# Patient Record
Sex: Male | Born: 1965 | Race: White | Hispanic: No | Smoking: Current every day smoker
Health system: Southern US, Community
[De-identification: ages and names within clinical notes are randomized; demographics above are authoritative.]

## PROBLEM LIST (undated history)

## (undated) DIAGNOSIS — F419 Anxiety disorder, unspecified: Secondary | ICD-10-CM

## (undated) DIAGNOSIS — G459 Transient cerebral ischemic attack, unspecified: Secondary | ICD-10-CM

## (undated) DIAGNOSIS — I209 Angina pectoris, unspecified: Secondary | ICD-10-CM

## (undated) DIAGNOSIS — J439 Emphysema, unspecified: Secondary | ICD-10-CM

## (undated) DIAGNOSIS — K219 Gastro-esophageal reflux disease without esophagitis: Secondary | ICD-10-CM

## (undated) DIAGNOSIS — F329 Major depressive disorder, single episode, unspecified: Secondary | ICD-10-CM

## (undated) DIAGNOSIS — R51 Headache: Secondary | ICD-10-CM

## (undated) DIAGNOSIS — I219 Acute myocardial infarction, unspecified: Secondary | ICD-10-CM

## (undated) DIAGNOSIS — Z8719 Personal history of other diseases of the digestive system: Secondary | ICD-10-CM

## (undated) DIAGNOSIS — F32A Depression, unspecified: Secondary | ICD-10-CM

## (undated) DIAGNOSIS — J069 Acute upper respiratory infection, unspecified: Secondary | ICD-10-CM

## (undated) DIAGNOSIS — M1990 Unspecified osteoarthritis, unspecified site: Secondary | ICD-10-CM

## (undated) DIAGNOSIS — G43909 Migraine, unspecified, not intractable, without status migrainosus: Secondary | ICD-10-CM

## (undated) DIAGNOSIS — R569 Unspecified convulsions: Secondary | ICD-10-CM

## (undated) DIAGNOSIS — J449 Chronic obstructive pulmonary disease, unspecified: Secondary | ICD-10-CM

## (undated) DIAGNOSIS — G8929 Other chronic pain: Secondary | ICD-10-CM

## (undated) DIAGNOSIS — R0602 Shortness of breath: Secondary | ICD-10-CM

## (undated) DIAGNOSIS — M549 Dorsalgia, unspecified: Secondary | ICD-10-CM

## (undated) DIAGNOSIS — F99 Mental disorder, not otherwise specified: Secondary | ICD-10-CM

## (undated) HISTORY — PX: BACK SURGERY: SHX140

---

## 1998-08-06 ENCOUNTER — Emergency Department (HOSPITAL_COMMUNITY): Admission: EM | Admit: 1998-08-06 | Discharge: 1998-08-06 | Payer: Self-pay | Admitting: Emergency Medicine

## 1999-03-08 ENCOUNTER — Emergency Department (HOSPITAL_COMMUNITY): Admission: EM | Admit: 1999-03-08 | Discharge: 1999-03-08 | Payer: Self-pay | Admitting: Emergency Medicine

## 1999-03-08 ENCOUNTER — Encounter: Payer: Self-pay | Admitting: Emergency Medicine

## 1999-07-14 ENCOUNTER — Emergency Department (HOSPITAL_COMMUNITY): Admission: EM | Admit: 1999-07-14 | Discharge: 1999-07-14 | Payer: Self-pay | Admitting: Emergency Medicine

## 2000-12-28 ENCOUNTER — Emergency Department (HOSPITAL_COMMUNITY): Admission: EM | Admit: 2000-12-28 | Discharge: 2000-12-28 | Payer: Self-pay | Admitting: Emergency Medicine

## 2001-10-29 ENCOUNTER — Emergency Department (HOSPITAL_COMMUNITY): Admission: EM | Admit: 2001-10-29 | Discharge: 2001-10-29 | Payer: Self-pay | Admitting: Emergency Medicine

## 2002-06-14 ENCOUNTER — Emergency Department (HOSPITAL_COMMUNITY): Admission: EM | Admit: 2002-06-14 | Discharge: 2002-06-14 | Payer: Self-pay | Admitting: Emergency Medicine

## 2002-06-14 ENCOUNTER — Encounter: Payer: Self-pay | Admitting: Emergency Medicine

## 2002-08-23 ENCOUNTER — Emergency Department (HOSPITAL_COMMUNITY): Admission: EM | Admit: 2002-08-23 | Discharge: 2002-08-23 | Payer: Self-pay | Admitting: *Deleted

## 2003-01-14 ENCOUNTER — Emergency Department (HOSPITAL_COMMUNITY): Admission: EM | Admit: 2003-01-14 | Discharge: 2003-01-15 | Payer: Self-pay

## 2003-01-15 ENCOUNTER — Encounter: Payer: Self-pay | Admitting: Emergency Medicine

## 2003-04-26 ENCOUNTER — Inpatient Hospital Stay (HOSPITAL_COMMUNITY): Admission: EM | Admit: 2003-04-26 | Discharge: 2003-04-28 | Payer: Self-pay | Admitting: Emergency Medicine

## 2003-05-05 ENCOUNTER — Inpatient Hospital Stay (HOSPITAL_COMMUNITY): Admission: EM | Admit: 2003-05-05 | Discharge: 2003-05-07 | Payer: Self-pay | Admitting: *Deleted

## 2003-06-04 ENCOUNTER — Inpatient Hospital Stay (HOSPITAL_COMMUNITY): Admission: EM | Admit: 2003-06-04 | Discharge: 2003-06-06 | Payer: Self-pay | Admitting: Emergency Medicine

## 2003-08-06 ENCOUNTER — Observation Stay (HOSPITAL_COMMUNITY): Admission: EM | Admit: 2003-08-06 | Discharge: 2003-08-08 | Payer: Self-pay | Admitting: Emergency Medicine

## 2003-08-07 ENCOUNTER — Encounter: Payer: Self-pay | Admitting: Cardiology

## 2003-09-12 ENCOUNTER — Emergency Department (HOSPITAL_COMMUNITY): Admission: EM | Admit: 2003-09-12 | Discharge: 2003-09-12 | Payer: Self-pay | Admitting: Emergency Medicine

## 2003-09-23 ENCOUNTER — Emergency Department (HOSPITAL_COMMUNITY): Admission: EM | Admit: 2003-09-23 | Discharge: 2003-09-23 | Payer: Self-pay

## 2003-10-07 ENCOUNTER — Emergency Department (HOSPITAL_COMMUNITY): Admission: EM | Admit: 2003-10-07 | Discharge: 2003-10-07 | Payer: Self-pay | Admitting: Emergency Medicine

## 2003-10-13 ENCOUNTER — Observation Stay (HOSPITAL_COMMUNITY): Admission: EM | Admit: 2003-10-13 | Discharge: 2003-10-14 | Payer: Self-pay | Admitting: Emergency Medicine

## 2004-05-05 ENCOUNTER — Emergency Department (HOSPITAL_COMMUNITY): Admission: EM | Admit: 2004-05-05 | Discharge: 2004-05-05 | Payer: Self-pay | Admitting: Emergency Medicine

## 2004-09-09 ENCOUNTER — Emergency Department (HOSPITAL_COMMUNITY): Admission: EM | Admit: 2004-09-09 | Discharge: 2004-09-09 | Payer: Self-pay | Admitting: *Deleted

## 2004-09-10 ENCOUNTER — Emergency Department (HOSPITAL_COMMUNITY): Admission: EM | Admit: 2004-09-10 | Discharge: 2004-09-10 | Payer: Self-pay | Admitting: *Deleted

## 2004-11-26 ENCOUNTER — Emergency Department (HOSPITAL_COMMUNITY): Admission: EM | Admit: 2004-11-26 | Discharge: 2004-11-26 | Payer: Self-pay | Admitting: Emergency Medicine

## 2005-01-16 ENCOUNTER — Emergency Department (HOSPITAL_COMMUNITY): Admission: EM | Admit: 2005-01-16 | Discharge: 2005-01-17 | Payer: Self-pay | Admitting: Emergency Medicine

## 2005-01-21 ENCOUNTER — Encounter: Admission: RE | Admit: 2005-01-21 | Discharge: 2005-01-21 | Payer: Self-pay | Admitting: Family Medicine

## 2005-02-02 ENCOUNTER — Inpatient Hospital Stay (HOSPITAL_COMMUNITY): Admission: RE | Admit: 2005-02-02 | Discharge: 2005-02-03 | Payer: Self-pay | Admitting: Orthopaedic Surgery

## 2005-03-07 ENCOUNTER — Ambulatory Visit (HOSPITAL_COMMUNITY): Admission: RE | Admit: 2005-03-07 | Discharge: 2005-03-07 | Payer: Self-pay | Admitting: Orthopaedic Surgery

## 2005-09-09 ENCOUNTER — Encounter: Admission: RE | Admit: 2005-09-09 | Discharge: 2005-09-16 | Payer: Self-pay | Admitting: Orthopedic Surgery

## 2005-11-13 ENCOUNTER — Emergency Department (HOSPITAL_COMMUNITY): Admission: EM | Admit: 2005-11-13 | Discharge: 2005-11-13 | Payer: Self-pay | Admitting: Emergency Medicine

## 2006-09-14 ENCOUNTER — Encounter
Admission: RE | Admit: 2006-09-14 | Discharge: 2006-12-13 | Payer: Self-pay | Admitting: Physical Medicine & Rehabilitation

## 2006-09-19 ENCOUNTER — Ambulatory Visit: Payer: Self-pay | Admitting: Physical Medicine & Rehabilitation

## 2006-09-26 ENCOUNTER — Emergency Department (HOSPITAL_COMMUNITY): Admission: EM | Admit: 2006-09-26 | Discharge: 2006-09-26 | Payer: Self-pay | Admitting: Emergency Medicine

## 2006-10-27 ENCOUNTER — Encounter
Admission: RE | Admit: 2006-10-27 | Discharge: 2006-12-26 | Payer: Self-pay | Admitting: Physical Medicine and Rehabilitation

## 2006-10-27 ENCOUNTER — Ambulatory Visit: Payer: Self-pay | Admitting: Physical Medicine and Rehabilitation

## 2006-10-27 ENCOUNTER — Encounter
Admission: RE | Admit: 2006-10-27 | Discharge: 2006-10-27 | Payer: Self-pay | Admitting: Physical Medicine & Rehabilitation

## 2006-11-28 ENCOUNTER — Ambulatory Visit: Payer: Self-pay | Admitting: Physical Medicine & Rehabilitation

## 2006-12-27 ENCOUNTER — Encounter
Admission: RE | Admit: 2006-12-27 | Discharge: 2007-03-27 | Payer: Self-pay | Admitting: Physical Medicine & Rehabilitation

## 2007-01-25 ENCOUNTER — Ambulatory Visit: Payer: Self-pay | Admitting: Physical Medicine & Rehabilitation

## 2007-02-28 ENCOUNTER — Ambulatory Visit: Payer: Self-pay | Admitting: Physical Medicine & Rehabilitation

## 2007-03-04 ENCOUNTER — Emergency Department (HOSPITAL_COMMUNITY): Admission: EM | Admit: 2007-03-04 | Discharge: 2007-03-05 | Payer: Self-pay | Admitting: *Deleted

## 2007-03-18 ENCOUNTER — Emergency Department: Payer: Self-pay | Admitting: Emergency Medicine

## 2007-03-18 ENCOUNTER — Other Ambulatory Visit: Payer: Self-pay

## 2007-03-23 ENCOUNTER — Ambulatory Visit: Payer: Self-pay | Admitting: Emergency Medicine

## 2007-03-23 ENCOUNTER — Emergency Department: Payer: Self-pay | Admitting: Emergency Medicine

## 2007-04-02 ENCOUNTER — Ambulatory Visit: Payer: Self-pay | Admitting: Physical Medicine & Rehabilitation

## 2007-04-02 ENCOUNTER — Encounter
Admission: RE | Admit: 2007-04-02 | Discharge: 2007-07-01 | Payer: Self-pay | Admitting: Physical Medicine & Rehabilitation

## 2007-04-10 ENCOUNTER — Emergency Department (HOSPITAL_COMMUNITY): Admission: EM | Admit: 2007-04-10 | Discharge: 2007-04-10 | Payer: Self-pay | Admitting: Emergency Medicine

## 2007-04-27 ENCOUNTER — Ambulatory Visit: Payer: Self-pay | Admitting: Physical Medicine & Rehabilitation

## 2007-06-11 ENCOUNTER — Encounter
Admission: RE | Admit: 2007-06-11 | Discharge: 2007-09-09 | Payer: Self-pay | Admitting: Physical Medicine & Rehabilitation

## 2007-06-11 ENCOUNTER — Ambulatory Visit: Payer: Self-pay | Admitting: Physical Medicine & Rehabilitation

## 2007-07-09 ENCOUNTER — Ambulatory Visit (HOSPITAL_COMMUNITY)
Admission: RE | Admit: 2007-07-09 | Discharge: 2007-07-09 | Payer: Self-pay | Admitting: Physical Medicine & Rehabilitation

## 2007-07-16 ENCOUNTER — Ambulatory Visit: Payer: Self-pay | Admitting: Physical Medicine & Rehabilitation

## 2007-08-14 ENCOUNTER — Ambulatory Visit: Payer: Self-pay | Admitting: Physical Medicine & Rehabilitation

## 2007-09-10 ENCOUNTER — Encounter
Admission: RE | Admit: 2007-09-10 | Discharge: 2007-12-09 | Payer: Self-pay | Admitting: Physical Medicine & Rehabilitation

## 2007-10-09 ENCOUNTER — Ambulatory Visit: Payer: Self-pay | Admitting: Physical Medicine & Rehabilitation

## 2007-11-01 ENCOUNTER — Emergency Department: Payer: Self-pay | Admitting: Emergency Medicine

## 2007-11-06 ENCOUNTER — Ambulatory Visit: Payer: Self-pay | Admitting: Physical Medicine & Rehabilitation

## 2007-11-15 ENCOUNTER — Emergency Department (HOSPITAL_COMMUNITY): Admission: EM | Admit: 2007-11-15 | Discharge: 2007-11-15 | Payer: Self-pay | Admitting: Emergency Medicine

## 2007-11-28 ENCOUNTER — Encounter
Admission: RE | Admit: 2007-11-28 | Discharge: 2008-02-26 | Payer: Self-pay | Admitting: Physical Medicine & Rehabilitation

## 2007-12-04 ENCOUNTER — Ambulatory Visit: Payer: Self-pay | Admitting: Physical Medicine & Rehabilitation

## 2007-12-07 ENCOUNTER — Emergency Department (HOSPITAL_COMMUNITY): Admission: EM | Admit: 2007-12-07 | Discharge: 2007-12-08 | Payer: Self-pay | Admitting: Emergency Medicine

## 2007-12-23 ENCOUNTER — Emergency Department (HOSPITAL_COMMUNITY): Admission: EM | Admit: 2007-12-23 | Discharge: 2007-12-23 | Payer: Self-pay | Admitting: Family Medicine

## 2008-01-03 ENCOUNTER — Ambulatory Visit: Payer: Self-pay | Admitting: Physical Medicine & Rehabilitation

## 2008-02-01 ENCOUNTER — Ambulatory Visit: Payer: Self-pay | Admitting: Physical Medicine & Rehabilitation

## 2008-02-29 ENCOUNTER — Encounter
Admission: RE | Admit: 2008-02-29 | Discharge: 2008-05-29 | Payer: Self-pay | Admitting: Physical Medicine & Rehabilitation

## 2008-02-29 ENCOUNTER — Ambulatory Visit: Payer: Self-pay | Admitting: Physical Medicine & Rehabilitation

## 2008-04-08 ENCOUNTER — Ambulatory Visit: Payer: Self-pay | Admitting: Physical Medicine & Rehabilitation

## 2008-04-21 ENCOUNTER — Inpatient Hospital Stay (HOSPITAL_COMMUNITY): Admission: EM | Admit: 2008-04-21 | Discharge: 2008-04-24 | Payer: Self-pay | Admitting: Emergency Medicine

## 2008-04-21 ENCOUNTER — Ambulatory Visit: Payer: Self-pay | Admitting: Family Medicine

## 2008-05-07 ENCOUNTER — Ambulatory Visit: Payer: Self-pay | Admitting: Physical Medicine & Rehabilitation

## 2008-06-02 ENCOUNTER — Encounter
Admission: RE | Admit: 2008-06-02 | Discharge: 2008-06-03 | Payer: Self-pay | Admitting: Physical Medicine & Rehabilitation

## 2008-06-03 ENCOUNTER — Ambulatory Visit: Payer: Self-pay | Admitting: Physical Medicine & Rehabilitation

## 2008-06-18 ENCOUNTER — Emergency Department (HOSPITAL_COMMUNITY): Admission: EM | Admit: 2008-06-18 | Discharge: 2008-06-18 | Payer: Self-pay | Admitting: Emergency Medicine

## 2008-06-19 ENCOUNTER — Encounter: Admission: RE | Admit: 2008-06-19 | Discharge: 2008-06-19 | Payer: Self-pay | Admitting: Orthopaedic Surgery

## 2008-07-07 ENCOUNTER — Emergency Department (HOSPITAL_COMMUNITY): Admission: EM | Admit: 2008-07-07 | Discharge: 2008-07-07 | Payer: Self-pay | Admitting: Emergency Medicine

## 2008-09-06 ENCOUNTER — Emergency Department (HOSPITAL_COMMUNITY): Admission: EM | Admit: 2008-09-06 | Discharge: 2008-09-06 | Payer: Self-pay | Admitting: *Deleted

## 2008-09-15 ENCOUNTER — Emergency Department (HOSPITAL_COMMUNITY): Admission: EM | Admit: 2008-09-15 | Discharge: 2008-09-15 | Payer: Self-pay | Admitting: Emergency Medicine

## 2008-10-27 ENCOUNTER — Emergency Department (HOSPITAL_COMMUNITY): Admission: EM | Admit: 2008-10-27 | Discharge: 2008-10-27 | Payer: Self-pay | Admitting: Emergency Medicine

## 2009-02-17 ENCOUNTER — Emergency Department (HOSPITAL_COMMUNITY): Admission: EM | Admit: 2009-02-17 | Discharge: 2009-02-17 | Payer: Self-pay | Admitting: Emergency Medicine

## 2009-03-27 ENCOUNTER — Emergency Department (HOSPITAL_COMMUNITY): Admission: EM | Admit: 2009-03-27 | Discharge: 2009-03-27 | Payer: Self-pay | Admitting: Emergency Medicine

## 2009-04-02 ENCOUNTER — Emergency Department (HOSPITAL_COMMUNITY): Admission: EM | Admit: 2009-04-02 | Discharge: 2009-04-02 | Payer: Self-pay | Admitting: Family Medicine

## 2009-04-02 ENCOUNTER — Emergency Department (HOSPITAL_COMMUNITY): Admission: EM | Admit: 2009-04-02 | Discharge: 2009-04-02 | Payer: Self-pay | Admitting: Emergency Medicine

## 2009-06-10 ENCOUNTER — Emergency Department: Payer: Self-pay | Admitting: Emergency Medicine

## 2009-07-29 ENCOUNTER — Emergency Department (HOSPITAL_COMMUNITY): Admission: EM | Admit: 2009-07-29 | Discharge: 2009-07-30 | Payer: Self-pay | Admitting: Emergency Medicine

## 2009-08-02 ENCOUNTER — Emergency Department (HOSPITAL_COMMUNITY): Admission: EM | Admit: 2009-08-02 | Discharge: 2009-08-02 | Payer: Self-pay | Admitting: Emergency Medicine

## 2009-08-18 ENCOUNTER — Emergency Department (HOSPITAL_COMMUNITY): Admission: EM | Admit: 2009-08-18 | Discharge: 2009-08-18 | Payer: Self-pay | Admitting: Emergency Medicine

## 2009-08-23 ENCOUNTER — Emergency Department (HOSPITAL_COMMUNITY): Admission: EM | Admit: 2009-08-23 | Discharge: 2009-08-23 | Payer: Self-pay | Admitting: Emergency Medicine

## 2009-09-02 ENCOUNTER — Encounter: Admission: RE | Admit: 2009-09-02 | Discharge: 2009-09-02 | Payer: Self-pay | Admitting: Emergency Medicine

## 2009-10-01 ENCOUNTER — Emergency Department (HOSPITAL_COMMUNITY): Admission: EM | Admit: 2009-10-01 | Discharge: 2009-10-01 | Payer: Self-pay | Admitting: Emergency Medicine

## 2009-10-25 ENCOUNTER — Emergency Department: Payer: Self-pay | Admitting: Emergency Medicine

## 2009-10-26 DEATH — deceased

## 2010-01-23 ENCOUNTER — Emergency Department (HOSPITAL_COMMUNITY): Admission: EM | Admit: 2010-01-23 | Discharge: 2010-01-23 | Payer: Self-pay | Admitting: Emergency Medicine

## 2010-03-04 ENCOUNTER — Emergency Department (HOSPITAL_COMMUNITY): Admission: EM | Admit: 2010-03-04 | Discharge: 2009-10-14 | Payer: Self-pay | Admitting: Emergency Medicine

## 2010-04-18 ENCOUNTER — Encounter: Payer: Self-pay | Admitting: Neurology

## 2010-04-18 ENCOUNTER — Encounter: Payer: Self-pay | Admitting: Family Medicine

## 2010-06-12 LAB — BASIC METABOLIC PANEL
CO2: 27 mEq/L (ref 19–32)
Calcium: 9.5 mg/dL (ref 8.4–10.5)
Chloride: 108 mEq/L (ref 96–112)
Creatinine, Ser: 1.13 mg/dL (ref 0.4–1.5)
Glucose, Bld: 109 mg/dL — ABNORMAL HIGH (ref 70–99)

## 2010-06-12 LAB — CBC
MCH: 29 pg (ref 26.0–34.0)
MCHC: 33.3 g/dL (ref 30.0–36.0)
MCV: 87.1 fL (ref 78.0–100.0)
RBC: 5.56 MIL/uL (ref 4.22–5.81)
RDW: 13 % (ref 11.5–15.5)

## 2010-06-12 LAB — POCT CARDIAC MARKERS
CKMB, poc: 1.1 ng/mL (ref 1.0–8.0)
CKMB, poc: <1 ng/mL — ABNORMAL LOW (ref 1.0–8.0)
Troponin i, poc: <0.05 ng/mL (ref 0.00–0.09)

## 2010-06-12 LAB — DIFFERENTIAL
Basophils Absolute: 0.1 10*3/uL (ref 0.0–0.1)
Lymphocytes Relative: 33 % (ref 12–46)
Monocytes Absolute: 0.5 10*3/uL (ref 0.1–1.0)
Neutro Abs: 5.9 10*3/uL (ref 1.7–7.7)

## 2010-06-12 LAB — RAPID URINE DRUG SCREEN, HOSP PERFORMED
Amphetamines: NOT DETECTED
Benzodiazepines: POSITIVE — AB
Cocaine: NOT DETECTED
Tetrahydrocannabinol: NOT DETECTED

## 2010-06-13 LAB — RAPID URINE DRUG SCREEN, HOSP PERFORMED
Amphetamines: NOT DETECTED
Benzodiazepines: POSITIVE — AB
Tetrahydrocannabinol: POSITIVE — AB

## 2010-06-13 LAB — CK TOTAL AND CKMB (NOT AT ARMC)
CK, MB: 2.6 ng/mL (ref 0.3–4.0)
Relative Index: 2.1 (ref 0.0–2.5)
Total CK: 123 U/L (ref 7–232)

## 2010-06-13 LAB — BRAIN NATRIURETIC PEPTIDE: Pro B Natriuretic peptide (BNP): <30 pg/mL (ref 0.0–100.0)

## 2010-06-13 LAB — HEMOGLOBIN A1C: Hgb A1c MFr Bld: 5.8 % (ref 4.6–6.1)

## 2010-06-14 LAB — POCT I-STAT, CHEM 8
BUN: 12 mg/dL (ref 6–23)
Calcium, Ion: 1.13 mmol/L (ref 1.12–1.32)
Chloride: 106 mEq/L (ref 96–112)
Creatinine, Ser: 1.1 mg/dL (ref 0.4–1.5)
Glucose, Bld: 106 mg/dL — ABNORMAL HIGH (ref 70–99)
TCO2: 27 mmol/L (ref 0–100)

## 2010-07-05 LAB — POCT CARDIAC MARKERS
CKMB, poc: <1 ng/mL — ABNORMAL LOW (ref 1.0–8.0)
Myoglobin, poc: 150 ng/mL (ref 12–200)

## 2010-07-05 LAB — RAPID URINE DRUG SCREEN, HOSP PERFORMED
Amphetamines: NOT DETECTED
Barbiturates: NOT DETECTED
Benzodiazepines: POSITIVE — AB
Tetrahydrocannabinol: NOT DETECTED

## 2010-07-05 LAB — POCT I-STAT, CHEM 8
BUN: 16 mg/dL (ref 6–23)
Creatinine, Ser: 1.4 mg/dL (ref 0.4–1.5)
Glucose, Bld: 109 mg/dL — ABNORMAL HIGH (ref 70–99)
Hemoglobin: 17.7 g/dL — ABNORMAL HIGH (ref 13.0–17.0)
Potassium: 4.2 mEq/L (ref 3.5–5.1)
Sodium: 140 mEq/L (ref 135–145)
TCO2: 22 mmol/L (ref 0–100)

## 2010-07-12 LAB — CARDIAC PANEL(CRET KIN+CKTOT+MB+TROPI)
CK, MB: 1.8 ng/mL (ref 0.3–4.0)
CK, MB: 1.8 ng/mL (ref 0.3–4.0)
Relative Index: INVALID (ref 0.0–2.5)
Total CK: 73 U/L (ref 7–232)
Total CK: 77 U/L (ref 7–232)
Troponin I: <0.01 ng/mL (ref 0.00–0.06)
Troponin I: <0.01 ng/mL (ref 0.00–0.06)
Troponin I: <0.01 ng/mL (ref 0.00–0.06)

## 2010-07-12 LAB — LIPID PANEL
Cholesterol: 222 mg/dL — ABNORMAL HIGH (ref 0–200)
Total CHOL/HDL Ratio: 10.1 RATIO

## 2010-07-12 LAB — BASIC METABOLIC PANEL
BUN: 11 mg/dL (ref 6–23)
CO2: 26 mEq/L (ref 19–32)
CO2: 28 mEq/L (ref 19–32)
Chloride: 104 mEq/L (ref 96–112)
Chloride: 105 mEq/L (ref 96–112)
Chloride: 106 mEq/L (ref 96–112)
Creatinine, Ser: 1.19 mg/dL (ref 0.4–1.5)
GFR calc Af Amer: >60 mL/min (ref >60)
GFR calc non Af Amer: 57 mL/min — ABNORMAL LOW (ref >60)
Glucose, Bld: 85 mg/dL (ref 70–99)
Glucose, Bld: 94 mg/dL (ref 70–99)
Potassium: 4.1 mEq/L (ref 3.5–5.1)
Potassium: 4.7 mEq/L (ref 3.5–5.1)
Sodium: 140 mEq/L (ref 135–145)
Sodium: 141 mEq/L (ref 135–145)

## 2010-07-12 LAB — CBC
HCT: 41.8 % (ref 39.0–52.0)
HCT: 46.5 % (ref 39.0–52.0)
Hemoglobin: 14.5 g/dL (ref 13.0–17.0)
Hemoglobin: 15.4 g/dL (ref 13.0–17.0)
MCHC: 33.5 g/dL (ref 30.0–36.0)
MCHC: 33.9 g/dL (ref 30.0–36.0)
MCHC: 34.7 g/dL (ref 30.0–36.0)
MCV: 87.1 fL (ref 78.0–100.0)
MCV: 88.6 fL (ref 78.0–100.0)
Platelets: 236 10*3/uL (ref 150–400)
Platelets: 239 10*3/uL (ref 150–400)
RBC: 4.85 MIL/uL (ref 4.22–5.81)
RBC: 5.34 MIL/uL (ref 4.22–5.81)
RDW: 12.8 % (ref 11.5–15.5)
RDW: 12.9 % (ref 11.5–15.5)
RDW: 13.2 % (ref 11.5–15.5)
WBC: 11.1 10*3/uL — ABNORMAL HIGH (ref 4.0–10.5)
WBC: 11.4 10*3/uL — ABNORMAL HIGH (ref 4.0–10.5)

## 2010-07-12 LAB — DIFFERENTIAL
Basophils Absolute: 0 10*3/uL (ref 0.0–0.1)
Lymphocytes Relative: 19 % (ref 12–46)
Lymphs Abs: 2.1 10*3/uL (ref 0.7–4.0)
Neutro Abs: 8.5 10*3/uL — ABNORMAL HIGH (ref 1.7–7.7)
Neutrophils Relative %: 75 % (ref 43–77)

## 2010-07-12 LAB — COMPREHENSIVE METABOLIC PANEL
BUN: 16 mg/dL (ref 6–23)
CO2: 26 mEq/L (ref 19–32)
Calcium: 8.9 mg/dL (ref 8.4–10.5)
Chloride: 108 mEq/L (ref 96–112)
Creatinine, Ser: 1.14 mg/dL (ref 0.4–1.5)
GFR calc Af Amer: >60 mL/min (ref >60)
GFR calc non Af Amer: >60 mL/min (ref >60)
Total Bilirubin: 1.3 mg/dL — ABNORMAL HIGH (ref 0.3–1.2)

## 2010-07-12 LAB — RAPID URINE DRUG SCREEN, HOSP PERFORMED
Barbiturates: NOT DETECTED
Benzodiazepines: POSITIVE — AB

## 2010-07-12 LAB — POCT CARDIAC MARKERS
CKMB, poc: <1 ng/mL — ABNORMAL LOW (ref 1.0–8.0)
Myoglobin, poc: 82.3 ng/mL (ref 12–200)
Troponin i, poc: <0.05 ng/mL (ref 0.00–0.09)

## 2010-07-12 LAB — HEMOGLOBIN A1C: Mean Plasma Glucose: 108 mg/dL

## 2010-07-12 LAB — PROTIME-INR: Prothrombin Time: 13.3 seconds (ref 11.6–15.2)

## 2010-08-10 NOTE — Assessment & Plan Note (Signed)
Mr. Luecke returns today.  He has knee pain and back pain, as well as  left thigh pain.  His left thigh pain is due to a chronic radiculitis,  has refused 3 transforaminal injections in the past.  In terms of his  right knee pain, no falls.  Feels like he is putting more weight on this  knee, due to favoring the left knee.   His x-ray did not show any significant degenerative changes.  He has had  no clicking, no give way.  He feels like he has some instability, but  the exam has not shown this.   His knee examination is without effusion.  He has some mild medial  tenderness.  He has a positive Apley's grind test. Deep tendon reflexes  are 2+ bi-patellar, 1+ on the left.  Gait is with a limp, increased  stance phase on the right side.   Will inject his knee today.  He has been having some meniscal problems,  certainly does not appear to be a true OA.   Elevated blood pressure.  To follow up with primary physician, Dr.  Nathanial Rancher.   Will continue on his current oxycodone and Zanaflex.  Oxycodone is  simple with 5 mg one p.o. t.i.d., two p.o. q.h.s. and Zanaflex is 2 mg  p.o.b.i.d.   I will see him back in one month, EDS in one month, given some requests  for increasing pain meds, without a specific injury.      Erick Colace, M.D.  Electronically Signed     AEK/MedQ  D:  07/16/2007 11:54:19  T:  07/16/2007 12:29:08  Job #:  [id number]   cc:   Burnell Blanks, MD  Fax: [phone]

## 2010-08-10 NOTE — Assessment & Plan Note (Signed)
Mr. Ivins follows up today.  He saw me last about 2 months ago.  He has  been doing okay in terms of his chronic postoperative pain as well as  chronic left lower extremity radiculitis.  In fact, he was able to go to  [place] for [employer] [place] at [place].  He rode down with his brother-in-  law, but in a [place], stated that they stopped frequently so that they  could stretch.   His average pain is 3/10 with medications.  Relief from meds is good.  Pain interferes with activity at a moderate level.  Pain is worse with  walking, sitting, standing, and some other activities.  He can walk 5  minutes at a time.   His Oswestry disability index was completed today with a score of 65%.   REVIEW OF SYSTEMS:  Positive for tingling, trouble walking, spasms,  depression, and anxiety.   PAST MEDICAL HISTORY:  Heart disease, high blood pressure, and  arthritis.   SOCIAL HISTORY:  Married, smokes less than half a pack per day now.   PHYSICAL EXAMINATION:  VITAL SIGNS:  His blood pressure is 135/65, pulse  89, respirations 18, and O2 sat 94% on room air.  GENERAL:  Well-developed, well-nourished male, moderately overweight,  orientation x3.  Affect is alert.  EXTREMITIES:  Without edema.  BACK:  Has tenderness to palpation in bilateral lumbar paraspinals.  His  motor strength is 4 in bilateral lower extremities.  Deep tendon  reflexes are normal in the lower extremities with the exception of left  knee diminished.   IMPRESSION:  1. Chronic lumbar radiculitis.  2. Lumbar postlaminectomy syndrome with chronic postoperative pain.   PLAN:  1. Continue oxycodone 7.5/325 two p.o. t.i.d.  2. Continue Zanaflex 2 mg p.o. b.i.d.  3. Continue Neurontin 800 mg t.i.d.   Nursing visit in 2 months x2, with MD visit in 3 months.      Erick Colace, M.D.  Electronically Signed     AEK/MedQ  D:  01/03/2008 11:19:43  T:  01/03/2008 23:23:40  Job #:  [id number]

## 2010-08-10 NOTE — Assessment & Plan Note (Signed)
Patient was last seen by me November 28, 2006.  He has a chronic L3  radiculopathy with chronic left lower extremity pain and some weakness  in the left lower extremity.  He has had no new issues.  We increased  his Neurontin and Zanaflex, as well as Oxycodone.  He is doing a little  bit better with this, although still complaining of poor sleep, having  started Lunesta per primary care physician.   The pain is in the 6-7 range, compared to 9-10 range, last visit,  although it does increase with activity to the 9-10 range.  His pain is  mainly going down his left leg toward his knee and he has some left  forearm pain.  He can walk less than five minutes at a time.  He does  not climb steps.  He does not drive.  He needs some assistance with  dressing, bathing, household duties, shopping.   REVIEW OF SYSTEMS:  Positive for depression, anxiety, trouble walking,  spasms, numbness, tingling.   His blood pressure is 164/85, pulse 108, respiratory rate 18, O2 sat 97%  on room air.  AFFECT:  Depressed and anxious.  GAIT:  He is using a cane.  He shuffles his left leg forward.  No  evidence of toe-drag or knee instability.  SENSATION:  His sensation is reduced, left L3 dermatome.  Left patellar  DTR is reduced.  His quad strength is 4/5.  Remainder of muscle groups  in lower extremities is 5/5.   IMPRESSION:  Chronic L3 radiculopathy with left lower extremity pain.   PLAN:  1. Continue Oxycodone 7.5 t.i.d., but will increase h.s. dose to 15.  2. Neurontin 400 p.o. t.i.d.  3. Zanaflex 2 mg p.o. t.i.d.  4. I will see him back in one month to see how he is doing with sleep.      Encourage activity.  He states he has tried physical therapy in the      past.  We may discuss aquatic therapy next visit.      Erick Colace, M.D.  Electronically Signed     AEK/MedQ  D:  12/28/2006 14:43:13  T:  12/29/2006 09:44:59  Job #:  [id number]   cc:   Dr. [facility]

## 2010-08-10 NOTE — Assessment & Plan Note (Signed)
[name] is a 45 year old male that I followed here in this  clinic with history of chronic left L3 radiculopathy and lumbar post  laminectomy syndrome.  We have managed his narcotic analgesic  medications, has had urine drug testing in the past, most recently in  May showing no signs of illicit drugs.  He, however, was hospitalized at  [facility] in the [facility] and a urine drug screen obtained at  that time showed positive tetrahydrocannabinol.  I discussed this result  with the patient, which he denies.  I did indicate that passive smoke  could not cause a positive and I have no other explanation for this  finding other than he used THC, and therefore I can only offer him a  nonnarcotic analgesic medications for his chronic pain.  He stated that  he does not want to pursue this route.   PHYSICAL EXAMINATION:  VITAL SIGNS:  Blood pressure 137/81, pulse 58,  respirations 18, and O2 sat 96% on room air.  GENERAL:  in no acute distress.  EXTREMITIES:  Without erythema or exudates x3.  NEURO:  Affect is irritable.  Gait is with a limp using a cane.   He threw his empty pill bottle across the room and went into the waiting  room where he told the waiting patients that they can kiss my a.  The  patient will not be scheduled for followup visit.      Erick Colace, M.D.  Electronically Signed     AEK/MedQ  D:  06/03/2008 17:26:48  T:  06/04/2008 03:46:42  Job #:  [id number]   cc:   Burnell Blanks, MD  Fax: [phone]

## 2010-08-10 NOTE — Assessment & Plan Note (Signed)
[name] returns today.  He is a 45 year old male who has a chronic  left L3 radiculopathy demonstrated by EMG findings.  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  We have been  gradually increasing his Neurontin dose and is up to a fairly maximum  dose of 800 t.i.d.  This has been of some benefit.   CURRENT MEDICATIONS:  1. Oxycodone 7.5/325 one p.o. t.i.d. and 2 p.o. nightly.  2. Zanaflex 15 mg p.o. t.i.d.   He has had some increased right knee pain since I last him.  He does  give a history of an old stress fracture of the right knee about a year  ago.  He has had no new falls, no new trauma to that area.  His average  pain is in the 6-7/10 range.  He can walk less than five minutes at a  time without pain interfering.  He is on disability since around 2005  per his report.  He needs assistance with bathing and household duties,  otherwise independent.Marland Kitchen   REVIEW OF SYSTEMS:  Positive for numbness, tingling, trouble walking,  depression, anxiety.   SOCIAL HISTORY:  Married, accompanied by his wife.   INTERVAL MEDICAL HISTORY:  Since last visit, no new issues since April 02, 2007.   Blood pressure 129/79, pulse 93, respiratory rate 18.  O2 saturation 96%  on room air.  GENERAL:  No acute distress.  Mood and affect appropriate.  Coordination  is normal.  In the uppers, has halting movements and fear inhibition left greater  than right lower extremity.  Difficult to examine, resists movement.  Sensation is normal in lower extremities and upper extremities.  Lumbar  range of motion 50% forward flexion, 25% extension.  Right knee has  positive Apley's grind test.  Left knee has positive femoral stretch  test.  Deep tendon reflex 1+ on the left, 2+ on the right patellar, 2+  bilateral Achilles.  GAIT:  With a limp.  Reduced weightbearing on the left side.  Increase  stance space on the right side.  KNEES: : Show no evidence of effusion, no joint  line tenderness.  He has  crepitus bilateral patellar area.   IMPRESSION:  1. Left L3 radiculopathy.  2. Right knee pain.  Question degenerative joint disease.  Question      recurrent stress fracture.  Will recheck x-ray.   In regards to his left L3 radiculitis, already at maximum dose of  Neurontin.  Would benefit from an L3 injection.  However, he is hesitant  on this.  He does not like back injections.   We will continue with current medications listed above.  No signs of  aberrant drug behavior.   I will see him back after we check a right knee x-ray.  Consider major  joint injection should there be some sign of degenerative joint disease.  If not, consider bone scanning.      Erick Colace, M.D.  Electronically Signed     AEK/MedQ  D:  06/12/2007 14:13:11  T:  06/12/2007 16:22:49  Job #:  [id number]   cc:   Burnell Blanks, MD  Fax: [phone]

## 2010-08-10 NOTE — Assessment & Plan Note (Signed)
The patient was last seen by me on December 28, 2006, for radiculopathy  with chronic left lower extremity pain.  He has had some epidural  injections at [facility], but none are recorded over the last 7 years.  He  still has some problems with sleep.  Overall, he is feeling better on  increased dosing of oxycodone as well as Neurontin and Zanaflex.   Mood and affect are appropriate.  His pain level  is still about 10/10.  He has some depression and anxiety with problems with bathing, household  duties, and shopping.   PHYSICAL EXAMINATION:  VITAL SIGNS:  Blood pressure 137/81, pulse 94,  respiratory rate 16, O2 saturation 95% on room air.  GENERAL:  No acute distress, ambulates with a cane.  MUSCULOSKELETAL:  His lumbar spine has pain with forward flexion and  extension about 25-50%.  EXTREMITIES:  Lower extremity strength shows some decrease in quad  strength, decreased left quad reflex.   IMPRESSION:  Chronic left L3 radiculopathy.   PLAN:  1. L3 selective nerve root block.  2. Continue current medications which include oxycodone 7.5 three      times a day and 15 nightly; Neurontin 400 mg 3 times a day;      Zanaflex 3 times a day.   May need to refer to aquatic therapy as well.      Erick Colace, M.D.  Electronically Signed     AEK/MedQ  D:  01/26/2007 16:18:06  T:  01/27/2007 21:30:16  Job #:  [id number]

## 2010-08-10 NOTE — Assessment & Plan Note (Signed)
Mr. Brier returns today.  He had a right knee injection which really did  not help him last visit.  He states that both his back and knee pain has  increased as he has tried to increase his activities.  He got a new dog,  is walking it and this is causing increased pain.  His average pain is 6  to 7 out of 10.   He has had no new medical issues.  Pain is described as sharp, burning,  constant tingling increased with standing, walking, sitting.  Improves  with medication and medication relief he states is good, 7 out of 10.  He needs some help with household duties but otherwise is independent  with self-care.  He cannot climb steps.   REVIEW OF SYSTEMS:  Positive numbness, tingling, trouble walking.  He  has some depression, anxiety but no suicidal thoughts.   PHYSICAL EXAMINATION:  VITAL SIGNS:  Blood pressure 115/79, respiratory  20.  O2 sat 97% room air.  GENERAL:  No acute distress.  Affect is flat, anxious.  Gait-rduces  stance phase left extremity.  He has positive femoral stretch test on  the left side with some pain over the patellar region on the right side  as well as medial joint line but no evidence of effusion.  Deep tendon  reflexes are normal.  Strength ismildly reduced left quad otherwise nl.   IMPRESSION:  1. History of chronic left L3 radiculopathy.  2. Lumbar postlaminectomy syndrome.  3. Knee pain, right, intrarticular.  Has had previous  orthopedic      workup including MRI.   PLAN:  1. We will continue Neurontin 800 three times a day.  2. __________b.i.d.  3. Continue oxycodone 7.5 three times a day and two q.h.s.  He is      requesting higher dosing based on his increased activity.  We will      check urine drug screen and continue the current dosing for now.      Consider other adjustments if need be if his urine drug screen is      okay.      Erick Colace, M.D.  Electronically Signed     AEK/MedQ  D:  08/14/2007 10:12:07  T:  08/14/2007  10:57:52  Job #:  [id number]

## 2010-08-10 NOTE — Assessment & Plan Note (Signed)
HISTORY:  Mr. Hamrick returns today.  I last saw him on Aug 14, 2007.  He  has a history of chronic left L3 radiculopathy and lumbar  postlaminectomy syndrome.   He has had improvement in his right knee pain.  He did have an injection  a couple of months ago, although he had no immediate postinjection  relief at that time.  We did increase him up to 2 tablets 3 times a day  of his oxycodone and seemed to help him the most.  I developed the  medication regimen he has been on, he is also taking Neurontin 800  t.i.d. and Zanaflex 2 mg b.i.d.   He has had no new medical problems.   His functional status has improved per his report.  He has actually been  able to take his son fishing and states that he had not been able to do  that before.   His average pain is 3 to 4 right now, interferes with activity at 6/10  level.  Pain is worse with walking, bending, sitting, and standing.  Walks 5 minutes at a time, does not climb step or drive, needs some  assistance with dressing, household duties, and shopping.  He has some  numbness and tingling, trouble walking, spasms, depression, anxiety but  negative for suicidal thoughts.   SOCIAL HISTORY:  Married.  Lives with his wife.   PHYSICAL EXAMINATION:  VITAL SIGNS:  His blood pressure is 139/82, pulse  80, respiration 22, and O2 sat 98% on room air.  GENERAL:  No acute distress.  Orientation x3.  Affect is normal.  Gait  with a limp.  EXTREMITIES:  He has had reduced sensation in the left L3 dermatome.  He  has reduced quadriceps strength 4/5, but otherwise lower extremity  strength is 5/5.  His knees and ankles show no evidence of effusion.  He  has normal range of motion in lower extremities.   IMPRESSION:  1. Chronic left L3 radiculitis.  2. Lumbar postlaminectomy syndrome.  Overall, functional level is      improved on the current pain medication regimen.  We will continue      it as it is.  I will see him back in 3 months.  Have nursing  see      him on visits for pill count and monitor compliance every month      between the next MD visit.      Erick Colace, M.D.  Electronically Signed     AEK/MedQ  D:  10/09/2007 10:43:47  T:  10/10/2007 00:26:09  Job #:  [id number]

## 2010-08-10 NOTE — Cardiovascular Report (Signed)
NAME:  Vincent Dennis, Vincent Dennis NO.:  [id number]   MEDICAL RECORD NO.:  [id number]          PATIENT TYPE:  INP   LOCATION:  3738                         FACILITY:  [facility]   PHYSICIAN:  Nicki Guadalajara, M.D.     DATE OF BIRTH:  04/21/1965   DATE OF PROCEDURE:  04/23/2008  DATE OF DISCHARGE:                            CARDIAC CATHETERIZATION   INDICATIONS:  [name] is a 45 year old male who apparently  developed chest pain at age 26 and had mildly positive troponins.  Cardiac catheterization at that time showed normal coronary arteries  with an ejection fraction of 50%, and it was felt perhaps that his chest  pain was contributed by spasm.  He has a history of bipolar disorder, as  well as hypertension, hyperlipidemia, chronic pain syndrome, anxiety.  He does take numerous medications for pain.  He was admitted to [facility] on April 21, 2008, to the service of Dr. Jacquelyne Balint.  He  was seen in Cardiology consultation on April 22, 2008, by Dr. Allyson Sabal.  Because of risk factors, definitive cardiac catheterization was  recommended.   PROCEDURE:  After premedication with Versed 2 mg as well as 50 mcg of  fentanyl, the right femoral artery was punctured anteriorly and a 5-  French sheath was inserted without difficulty.  Diagnostic cardiac  catheterization was done utilizing 5-French Judkins for left and right  coronary catheters.  A 5-French pigtail catheter was used for RAO  ventriculography.  Because of his hypertensive history distal  aortography was also performed to make certain there was not any  renovascular etiology.  Hemostasis was obtained by direct manual  pressure.  The patient tolerated the procedure well.   HEMODYNAMIC DATA:  Central aortic pressure was 110/68.  Left ventricle  pressure 110/90, post A-wave 21.   Angiographic data:  Left main coronary was angiographically normal and  trifurcated into an LAD, ramus intermediate vessel and a dominant  left  circumflex coronary system.   The LAD and its branches were angiographically normal.  The LAD gave  rise to one major septal and diagonal vessel and several small  additional branches.   The ramus intermediate vessel was angiographically normal.   The circumflex vessel was angiographically normal and gave rise to 2  additional marginal vessels and ended in the posterolateral PDA like  system.   The right coronary artery was nondominant but angiographically normal  vessel.   RAO ventriculography revealed normal ejection fraction without focal  segmental wall motion abnormalities.  Ejection fraction was in the 50-  55% range.   Distal aortography did not reveal any evidence for renal artery  stenosis.  There was no significant aortoiliac disease.   IMPRESSION:  1. Low normal/normal left ventricular contractility with an ejection      fraction of 50-55%.  2. Normal coronary arteries.   RECOMMENDATIONS:  Medical therapy with continued risk factor  modification.           ______________________________  Nicki Guadalajara, M.D.     TK/MEDQ  D:  04/23/2008  T:  04/24/2008  Job:  [id number]   cc:   Burnell Blanks, MD

## 2010-08-10 NOTE — Assessment & Plan Note (Signed)
Date of last visit was 10/03/06. He had a nursing visit 11/01/06. Scores,  however, still make the pain as 8-9/10 on average and stating that he  cannot do his usual activities which even included some part-time work  as recently as March of 08.  His main pain is in the low back but has  some additional pain in the knees both anteriorly and posteriorly as  well as left leg. He has imaging studies including MRI in  2006 showing  some scarring around the left L3-4 area. He had an EMG demonstrating  chronic left L3 radiculopathy.   The patient has been on oxycodone 5 mg t.i.d. which is much lower than  previous dosage of 10 mg tablets, up to 7 a day. He does feel that the  Neurontin has been helpful and has been using that 300 mg 3 times a day  and the Zanaflex is more helpful than the Flexeril; he uses this b.i.d.   REVIEW OF SYSTEMS:  Please see health and history form for review of  systems and functional level.   SOCIAL HISTORY:  Smokes half pack a day. Lives with his 2 sons.   EXAMINATION:  GENERAL: No acute distress. Mood and affect appropriate.  VITAL SIGNS: Blood pressure 160/109, pulse 100, respirations 18, 02 sat  92% on room air.  BACK: He has mild tenderness in the lumbosacral junction on the left  side.  EXTREMITIES: He has pain with knee extension but has good strength in  bilateral upper and lower extremities. He has normal range of motion in  the lower extremities. Gait is antalgic favoring left lower extremity.  Sensation reduced over left kneecap in the anterolateral thigh.   IMPRESSION:  Chronic L3 radiculopathy.   PLAN:  1. Will increase Neurontin to 400 t.i.d.  2. Increase Zanaflex to 2 mg t.i.d.  3. Will increase his oxycodone to 7.5 q.i.d.   FOLLOWUP:  I will see him back in 1 month.      Erick Colace, M.D.  Electronically Signed     AEK/MedQ  D:  11/28/2006 17:36:43  T:  11/29/2006 08:17:19  Job #:  [id number]

## 2010-08-10 NOTE — Group Therapy Note (Signed)
PHYSICAL MEDICINE REHABILITATION/PAIN MANAGEMENT CONSULTATION   HISTORY:  A 45 year old male who indicates he fell in 2006 with a  resultant back pain and left knee pain.  Underwent further medical  evaluation.  Was found to have a left L3-4 disk.  This was determined by  MRI scan December 19, 2004, indicating a moderately large lateral disk  protrusion on the left at L3-4.  He was admitted November 05, 2004 for the  surgery.  He underwent left L3-4 extraforaminal diskectomy via  intertransverse approach.  No postoperative complications.  The patient  states he had continued pain afterwards.  He has burning pain left knee.  He has been managed by Dr. Ethelene Hal at [facility], but was  discharged due to marijuana usage, included are 2 [employer] urine drug  screens indicating positive THC.  In addition, 1 of the urine drug  screens dated May 27, 2005 indicated negative oxycodone despite the  patient reportedly taking it and 1 urine drug screen July 03, 2006  indicating hydrocodone without history of hydrocodone prescription.  Other history includes right knee pain, found to have a tibial plateau  stress fracture.  No falls.  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  Had an MRI of the right knee showing edema, stress  mediated fracture diagnosed by Dr. Ranell Patrick on May 24, 2006.  The  last bottle he has with him in terms of Percocet is from February.  Do  indicate that Dr. Ethelene Hal wrote a prescription for Percocet on June 29, 2006.  That bottle was not with the patient.   Other records reviewed include cervical spine MRI without contrast dated  December 30, 2004, posterior neck pain with left arm numbness.  He had  some cervical spondylosis, also vertebral degenerative changes.  Left  foraminal narrowing C2-3.  Right narrowing C4-5, and bilateral at T5-6.  No significant upper extremity complaints, other than some wrist pain.  His back pain is averaging 9/10,  described as constant, tingling,  aching, stabbing, sharp, burning.  Sleep is poor.  Pain is worse with  walking, sitting, bending, standing.  In terms of medication, the need  for medication is good.  He uses a cane at times because he is afraid  the left leg is going to go out on him.  He lives alone, but indicates  he needs some help with certain household duties and shopping.  Occasional bathing.  He is not employed for the last few months at  least.  He could not give me any dates, but based on medical records, he  was employed in February.  He has had some tingling in the left foot and  ankle.   REVIEW OF SYSTEMS:  Positive for depression and anxiety, but negative  for suicidal thoughts.  Does have a past medical history of significant  diabetes, as well as high blood pressure.   SOCIAL HISTORY:  Separated from his wife, 2 sons.  Wife is with him  today.   FAMILY HISTORY:  Diabetes, high blood pressure, psychiatric problems.   EXAMINATION:  Blood pressure 151/83, pulse 97, respirations 16, O2  saturation 96% on room air.  His calf circumference is 42.5 cm right, 42.0 left 10 cm below the  inferior pole of the patella and at the thighs it is equal at 48.5 at 10  cm above the superior pole of the patella.  Deep tendon reflexes are 1+  in the left patella, 2+ in the  right, 2+ bilateral ankles.  BACK:  Poor posture.  Does not stand up straight.  Tends to lean towards  the right side.  His strength in the lower extremities is full, as it is  in the upper extremities.  NECK:  Range of motion is 75% forward flexion, extension, lateral  rotation, and bending.   IMPRESSION:  1. Chronic L4 radiculitis, may have some weakness related to      radiculopathy.  Will need EMG to further evaluate.  2. Lumbar post laminectomy syndrome.  3. Chronic pain syndrome.  4. History of illicit drug use in the past.   PLAN:  1. Will check urine drug screen today.  No narcotic prescription until       results are available to me.  2. Given that Flexeril does not appear to be controlling some of his      muscle spasms that he complains of in his back, will give him a      prescription for Zanaflex tablets 2 mg b.i.d. p.r.n.  3. Lidoderm patch to the medial knee on the right, as well as the      medial leg.  4. Schedule for EMG left lower extremity.   Thank you for this consultation.      Vincent Dennis, M.D.  Electronically Signed     AEK/MedQ  D:  09/19/2006 14:00:09  T:  09/19/2006 17:01:48  Job #:  [id number]   cc:   Vincent Dennis. Ethelene Hal, M.D.  Fax: [phone]   Vincent Dennis, M.D.  Fax: [phone]

## 2010-08-10 NOTE — H&P (Signed)
NAME:  Vincent Dennis, Vincent Dennis NO.:  [id number]   MEDICAL RECORD NO.:  [id number]          PATIENT TYPE:  INP   LOCATION:  1857                         FACILITY:  [facility]   PHYSICIAN:  Leighton Roach McDiarmid, M.D.DATE OF BIRTH:  11-06-1965   DATE OF ADMISSION:  04/21/2008  DATE OF DISCHARGE:                              HISTORY & PHYSICAL   PRIMARY CARE Swara Donze:  Unassigned.  (Dr. Burnell Blanks of [facility])   CHIEF COMPLAINT:  Chest tightness.   HISTORY OF PRESENT ILLNESS:  Patient is a 45 year old male with a  history of myocardial infarction at age 34, hyperlipidemia, questionable  diabetes mellitus, anxiety, bipolar disorder and chronic back pain who  presents with complaint of chest tightness x 1 day.  Patient states that  at 7:30 this morning the patient's chest felt real tight similar to his  prior MI.  He took a nitroglycerin with no relief.  Patient also states  that his arms felt heavy.  Denies diaphoresis or radiation of the pain.  After about 45 minutes to 1 hour the pain resolved.  Subsequently,  patient also developed perioral numbness which typically precedes his  migraine headaches, as well as some difficulty articulating sentences.  Patient did not take his Imitrex and these symptoms subsequently  resolved.  Patient got up to ambulate to his front door and went out to  his patio and the railing broke and patient fell off the step onto a  concrete patio with his chest hitting first, then subsequently hit his  head.  Patient uses a cane at baseline; however, does report  some  increased instability and continued dysarthria prior to the fall.  Patient does report that the chest tightness recurred upon transport to  the hospital via EMS and is present currently.   PAST MEDICAL HISTORY:  1. Hypertension.  2. Hyperlipidemia.  3. Coronary artery disease, status post myocardial infarction at age      56.  4. Questionable history of diabetes  mellitus.  5. Chronic back pain status post back surgery.  6. Chronic pain syndrome.  7. Anxiety.  8. Bipolar disorder.   SURGICAL HISTORY:  Status post back surgery.   SOCIAL HISTORY:  Patient is married, however, does not live with his  wife.  Smokes 1/2 pack per day.  Has 3 children.  Denies alcohol use.  Does endorse some occasional marijuana use.   FAMILY HISTORY:  Significant for coronary artery disease in multiple  brothers and father, a brother who died at age 54 who had MI prior, the  father had diabetes mellitus.   MEDICATIONS:  1. Xanax 2 mg p.o. t.i.d.  2. Zanaflex 2 mg p.o. b.i.d.  3. Neurontin 800 mg p.o. t.i.d.  4. Percocet 10.5/325 two tabs p.o. t.i.d.  5. Diltiazem 360 mg p.o. daily.  6. Lopressor.  7. Fish oil.   ALLERGIES:  CODEINE causes patient to have hives.   REVIEW OF SYSTEMS:  Complete review of systems performed and negative,  unless otherwise noted in history of present illness.   PHYSICAL EXAMINATION:  VITAL SIGNS:  Temperature 97.6, blood pressure  136/88, heart rate 81, respiratory rate 13, oxygen saturation 96% on  room air.  GENERAL:  Mildly anxious, thin male in cervical spine collar, awake,  alert, although somewhat drowsy-appearing following pain medication  administration.  HEENT:  Dry mucous membranes.  Pupils equal, round, and reactive to  light and accommodation.  Extraocular movements intact.  Vision and  hearing grossly normal.  CARDIOVASCULAR:  Regular rate and rhythm, no murmurs, rubs, or gallops,  no reproducible pain of anterior chest wall with palpation.  RESPIRATORY:  Lungs clear to auscultation bilaterally with normal work  of breathing.  ABDOMEN:  Soft, nontender, nondistended with positive bowel sounds.  EXTREMITIES:  No peripheral edema, cyanosis.  NEUROLOGIC:  Cranial nerves II through XII grossly intact with the  exception of some questionable decreased sensation in the region of V1  and V2 of the facial nerve that  patient states is unchanged, some  weakness of the left lower extremity which is unchanged.  The rest of  the patient's strength is 5/5 in the bilateral upper extremities and 3  to 4/5 in the right lower extremity with some decrease in strength  secondary to lumbar spinal pain.   LABS:  Urine drug screen positive for benzodiazepines, opiates, THC.  Sodium 139, potassium 4.6, chloride 108, bicarbonate 26, glucose 99, BUN  16, creatinine 1.14.  Total bilirubin 1.3, alkaline phosphatase 61, AST  23, ALT 20, total protein 6.6, albumin 3.6, calcium 8.9.  Alcohol level  less than 5.  First set of point of care cardiac markers negative.  Hemoglobin 15.8, white blood cell count 11.4, hematocrit 46.5, platelets  230.  Lumbar spine x-ray with no acute fracture or listhesis identified.  A CT chest with lower lung volumes with dependent pulmonary opacity  bilaterally favorable for atelectasis, no acute trauma in the chest,  chronic mediastinal lymphadenopathy unchanged from 2006,  no acute  osseous abnormalities, no acute fracture or listhesis in the cervical  spine and stable straightening of the cervical lordosis and degenerative  changes.  Chest x-ray with pulmonary vascular congestion and low lung  volumes; otherwise, no acute abnormalities.  CT head pending.  EKG with  normal sinus rhythm.   ASSESSMENT/PLAN:  Patient is a 45 year old male with a history of  coronary artery disease, myocardial infarction, hyperlipidemia,  questionable diabetes mellitus, anxiety, bipolar disorder, migraines who  presents with chest tightness x1 day and status post fall.   1. Chest tightness:  Given patient's history of myocardial infarction,      will rule out acute coronary syndrome with cardiac markers q.8      hours x3.  We will check the EKG in the morning.  patient's EKG and      point of care markers in the emergency department are unremarkable.      We will check a fasting lipid panel and A1c in the  morning.  We      will contain aspirin, metoprolol and Tricor.  Urine drug screen      negative for cocaine.  We will contain diltiazem, as well.  I will      place patient on a telemetry bed.  2. 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  Will avoid Imitrex in this setting.      We will await head CT results to rule out concerning intracranial      process.  3. Status post fall.  Continue home pain medications of Percocet and      Neurontin.  We will add p.r.n.  Dilaudid for breakthrough pain for      24 hours.  Patient's spinal imaging reassuring so we will      discontinue cervical spine collar.  4. Anxiety:  Continue Xanax.  5. Tobacco abuse:  Nicotine patch for smoking cessation.  6. Hyperlipidemia:  Continue Tricor and check fasting lipid panel.  7. Hypertension.  Continue Lopressor, diltiazem.  8. Fluid, electrolytes and nutrition/gastrointestinal:  A heart-      healthy, carbohydrate-modified diet pending A1c results.  9. Prophylaxis:  Heparin.  10.Disposition:  Patient is full code.  A full disposition pending      rule out acute coronary syndrome.      Lauro Franklin, MD  Electronically Signed      Leighton Roach McDiarmid, M.D.  Electronically Signed    TCB/MEDQ  D:  04/21/2008  T:  04/21/2008  Job:  [id number]

## 2010-08-10 NOTE — Assessment & Plan Note (Signed)
A 45 year old male with chronic left L3 radiculopathy which was  demonstrated on EMG.  He decided he did not want any epidural steroid  injections.   CURRENT MEDICATIONS:  1. Oxycodone 7.5/325 one p.o. t.i.d. and two tablets at bedtime.  2. Neurontin.  Last visit increased to 600 t.i.d.  This has helped      better than the 400.  3. Zanaflex 2 mg t.i.d.   He has had no new problems since I last saw him.  He walks less than 5  minutes at a time.  He has been a little bit less active, sitting around  more over the holidays.  Functionally, he needs some assistance with  dressing, bathing, household duties, shopping which is the same as last  visit.   REVIEW OF SYSTEMS:  Numbness, tingling in the left knee, trouble  walking, spasms.  No depression or anxiety noted on this visit compared  to last visit.   SOCIAL HISTORY:  Lives with his wife.  He smokes half a pack a day.  He  has lost 20 pounds.   PHYSICAL EXAMINATION:  VITAL SIGNS:  Blood pressure 157/88, pulse 95,  respirations 18, O2 sat 95% on room air.  GENERAL:  No acute distress.  Orientation x3.  Gait is using a cane,  favors the left lower extremity.  Gait shows no evidence of toe drag or  knee instability.  No knee buckling.  BACK:  Mild tenderness to palpation in the lumbar paraspinal.  EXTREMITIES:  Normal strength in bilateral upper and lower extremities.   IMPRESSION:  Chronic left L3 radiculopathy with neuropathic pain  syndrome.   PLAN:  1. Increase Neurontin to 800 t.i.d.  2. Continue oxycodone 7.5 t.i.d. and 15 q.h.s.  3. Continue Zanaflex 2 mg t.i.d.   I will see him back in 2 months.  Nursing visit in between now and then.      Erick Colace, M.D.  Electronically Signed     AEK/MedQ  D:  04/02/2007 14:06:30  T:  04/02/2007 14:25:05  Job #:  [id number]   cc:   Burnell Blanks, MD  Fax: [phone]

## 2010-08-10 NOTE — Assessment & Plan Note (Signed)
Mr. Lucarelli last saw me on January 03, 2008 for his chronic postoperative  pain, as well as, chronic lower extremity radiculitis.  He has lumbar  post laminectomy syndrome.  He started taking some over-the-counter  omega III fatty acids.  He thinks it helps in terms of joint pain.   There is some relief from medications is good.  He has had no  intercurrent medical issues.   Oswestry disability index completed today and it was not completed  today, it was completed already.  Score was 64%, which is stable  compared to prior.   REVIEW OF SYSTEMS:  Positive for trouble walking, anxiety, numbness.  Walking tolerance is 5 minutes.  He does not climb steps.  He does not  drive.  His average pain is in the 3-4/10 range, sharp, burning,  tingling.  It is relieved from medications good.  Pain increases with  walking and standing.   PRIMARY CARE PHYSICIAN:  Burnell Blanks, MD   The patient states he forgot to take his blood pressure medicine today.   PHYSICAL EXAMINATION:  VITAL SIGNS:  Blood pressure 164/92, pulse 119,  respirations 18, and O2 sat 98% room air.  GENERAL:  Overweight male in no acute distress.  Orientation x3.  Affect  is alert.  Gait is without limp.  Favoring the left lower extremity.  NEUROLOGIC:  His deep tendon reflexes are normal in bilateral extremity.  Sensation normal in bilateral extremities.  EXTREMITIES:  No evidence of edema.  His motor strength is 3+ in the  left quad, 4 in the left hip flexor, 5 in ankle dorsiflexor, 5  throughout in the right lower extremity.   IMPRESSION:  1. Chronic L3 radiculitis.  2. Lumbar post laminectomy syndrome with chronic postoperative pain.   PLAN:  He is stabilized on current medications.  No signs of aberrant  drug behavior.  He did miss an appointment, but did reschedule, his  appointment was originally on March 25, 2008.  I will see him back in  3 months, nursing will monitor for compliance, see him back in 1 month.   We  will continue:  1. Oxycodone 7.5/325 two p.o. t.i.d.  2. Neurontin 800 mg t.i.d.  3. Zanaflex 2 mg p.o. b.i.d.      Erick Colace, M.D.  Electronically Signed    AEK/MedQ  D:  04/08/2008 16:06:28  T:  04/09/2008 06:13:08  Job #:  [id number]   cc:   Burnell Blanks, MD  Fax: [phone]

## 2010-08-10 NOTE — Discharge Summary (Signed)
NAME:  Vincent Dennis, Vincent Dennis NO.:  [id number]   MEDICAL RECORD NO.:  [id number]          PATIENT TYPE:  INP   LOCATION:  3738                         FACILITY:  [facility]   PHYSICIAN:  Leighton Roach McDiarmid, M.D.DATE OF BIRTH:  14-Sep-1965   DATE OF ADMISSION:  04/21/2008  DATE OF DISCHARGE:  04/24/2008                               DISCHARGE SUMMARY   PRIMARY CARE Mccayla Shimada:  Burnell Blanks, MD, at [facility].   DISCHARGE DIAGNOSES:  1. Noncardiac chest pain.  2. Chronic low back pain.  3. Complicated migraine.  4. Hypertension.  5. Hyperlipidemia.  6. History of myocardial infarction at age 60.  49. Anxiety.  8. Tobacco use.  9. Bipolar disorder.   DISCHARGE MEDICATIONS:  1. Crestor 10 mg 1 tab by mouth daily.  2. Xanax 2 mg 1 tab by mouth 3 times a day.  3. Aspirin 81 mg 1 tab by mouth daily.  4. Cardizem 360 mg 1 tab by mouth daily.  5. TriCor 145 mg 1 tab by mouth daily.  6. Neurontin 800 mg 1 tab by mouth 3 times a day.  7. Metoprolol 50 mg 1 tab by mouth twice a day.  8. Percocet 7.5/325 mg 2 tabs by mouth every 8 hours as needed for      pain.  9. Zanaflex 2 mg 1 tab by mouth twice a day.  10.Fish oral 2 tabs by mouth daily.   CONSULTANTS:  Cardiology.   PROCEDURES:  1. Chest x-ray:  No acute cardiopulmonary abnormality.  2. CT scan of C-spine:  No acute traumatic injury identified in the      chest.  Chronic mediastinal lymphadenopathy, not significantly      changed from 2006.  No acute fracture or listhesis identified in      the cervical spine.  Ligamentous injury is not excluded.  Stable      straining of cervical lordosis and degenerative changes.  3. CT scan of chest:  No acute traumatic injury identified in the      chest.  Chronic mediastinal adenopathy not significantly changed      since 2006.  Lower lung volumes with dependent pulmonary opacities      bilaterally, favor atelectasis.  4. X-ray of lumbar spine:  No acute fracture  or listhesis identified      in the lumbar spine.  5. CT scan of head:  No acute intracranial abnormality.  6. Cardiac catheterization:  Normal coronary arteries.  Normal left      ventricular function.   LABORATORY DATA:  1. CBC on April 21, 2008:  White count 11.4, hemoglobin 15.8.  2. Alcohol less than 5.  3. Urine drug screen positive for benzos and opiates and marijuana.  4. Cardiac enzymes negative x3.  5. Lipid profile:  Total cholesterol 222, triglycerides 470, HDL 22,      and LDL unable to calculate.  6. Hemoglobin A1c 5.4.  7. CBC on April 24, 2008:  WBC 8.8, hemoglobin 14.5.   BRIEF HOSPITAL COURSE:  The patient is a 45 year old male with a history  of an MI when he was 37 who presented with chest pain.  1. Chest pain.  The patient had acute onset of substernal tightness on      the day of admission.  The patient reported having had an MI 5      years prior which seemed to be most likely due to coronary spasm.      This presentation was similar in character to his prior MI.  The      patient had tightness in his chest, which radiated to the left arm      as well as having bilateral arm weakness.  Of note, the patient      also had a traumatic event just prior to admission.  The patient      was going outside, normally walks with a cane, he held onto the      railing on the steps which gave way and he fell forward onto his      chest.  The patient was admitted for rule out MI.  Cardiac enzymes      were negative x3 and EKG was unchanged.  A fasting lipid profile      was obtained which showed elevated total cholesterol and suboptimal      LDL, so the patient was started on Crestor.  We also continued the      patient's TriCor, metoprolol, Cardizem, and aspirin.  Cardiology      was consulted.  They were concerned enough with his chest pain and      history to undergo a cath.  Cardiac cath was performed on April 23, 2008, without any complications.  The results  showed normal      coronary arteries and normal left ventricular function.  So most      likely source of this chest pain is noncardiac.  It is most likely      musculoskeletal or GERD.  The pain was still present on the day of      discharge, but much improved.  Pain was reproducible with palpation      of the sternum.  On the day of discharge, the patient actually had      clinically improved.  He said his chest pain was feeling much      better and was agreeable with discharge.  2. Trauma:  The patient did have a fall his prior to admission.  He      does have a history of low back pain.  This seemed to be      exacerbated as well as having some pain in his neck and his chest.      CT scan of head, neck, and chest showed no acute fractures or      traumatic injuries.  X-ray of lumbar spine also showed no fractures      or listhesis.  3. Low back pain.  The patient has chronic history of low back pain      since his surgery for a herniated disk.  The patient at baseline      walks with a cane and has a 5/10 low back pain at baseline.  The      patient normally takes Percocet 7.5/325 mg 2 tabs every 8 hours for      pain.  The patient was placed back on his home dose of Percocet at      7.5/325, which seemed to help control the pain enough.  The patient      will be discharged home with a few extra dose of the 5/325 mg      Percocet for breakthrough pain since the patient does have some of      the 7.5/325 mg at home.  The patient will follow up with Dr.      Wynn Banker, his pain clinic doctor for refills and further workup if      needed.  4. Anxiety.  The patient was continued on his Xanax.  5. Tobacco use.  The patient was encouraged strongly to quit.  The      patient was given nicotine patch in the hospital.  6. Hyperlipidemia.  The patient's lipid profile was suboptimal.  The      patient was started on Crestor.  The patient was also continued on      his TriCor.  7.  Hypertension.  The patient's blood pressure was acceptable      throughout his hospital stay.  The patient was continued on his      Cardizem and metoprolol.   DISCHARGE INSTRUCTIONS:  The patient is to increase his activity slowly  and be cautious while ambulating.  The patient should request extra help  for anything require lifting or excessive movement.  The patient should  have a low-sodium, heart-healthy diet.   FOLLOWUP:  The patient is to return to Dr. Burnell Blanks at [facility], phone number [phone].  The patient is to call and  schedule an appointment.  The patient is also to return to Dr. Wynn Banker  with the [facility].  The patient is to call and schedule an  appointment for next available.   DISCHARGE CONDITION:  Stable.      Angelena Sole, MD  Electronically Signed      Leighton Roach McDiarmid, M.D.  Electronically Signed    WS/MEDQ  D:  04/24/2008  T:  04/24/2008  Job:  [id number]

## 2010-08-10 NOTE — Assessment & Plan Note (Signed)
This is a 45 year old male with chronic left L3 radiculopathy as  demonstrated by EMG.  He decided he did not want any more injections  even though he was scheduled for one and no-showed.   He has been taking the current medications of:  1. Oxycodone 7.5 t.i.d. and 15 at bedtime.  2. Neurontin 400 t.i.d.  3. Zanaflex 2 mg t.i.d. as well.   The patient has been declining in terms of his activity level.  It is a  long car ride to [place].  He will walk for less than five minutes at a  time.  He has difficulty with dressing, bathing, household duties, and  shopping.   REVIEW OF SYSTEMS:  Positive for numbness and tingling in the left upper  and lower extremity, depression, anxiety, insomnia.   SOCIAL HISTORY:  Married, lives with his wife and son.  He smokes half a  pack a day.   PHYSICAL EXAMINATION:  Blood pressure 135/84, pulse 110, respiratory  rate 18.  Ambulation with a cane but really walks without a cane, walks slowly,  favors left lower extremity.  No toe drag or knee instability.  No knee  buckling.   Decreased sensation, left L3 dermatome as well as in the feet compared  to the upper leg bilaterally.  No sensation deficits in the upper  extremities.  Normal strength in bilateral upper and lower extremities.   IMPRESSION:  Chronic left L3 radiculopathy with neuropathic pain.   PLAN:  1. Increase Neurontin 600 t.i.d.  2. Continue Oxycodone 7.5 t.i.d. and 15 at bedtime.  3. Continue Zanaflex 2 mg t.i.d.  May increase on this.  4. Probable early diabetic neuropathy.  He did have absent sural nerve      on the left knee and prolonged on the right, prolonged left tibial      motor as well.   Will continue medicines as listed above.  I will refer him to physical  therapy and see him back in one month.      Erick Colace, M.D.  Electronically Signed     AEK/MedQ  D:  03/01/2007 16:49:05  T:  03/02/2007 01:46:27  Job #:  [id number]   cc:   Burnell Blanks, MD  Fax: [phone]

## 2010-08-10 NOTE — Procedures (Signed)
NAME:  Vincent Dennis, FIDALGO NO.:  [id number]   MEDICAL RECORD NO.:  [id number]            PATIENT TYPE:   LOCATION:                                 FACILITY:   PHYSICIAN:  Erick Colace, M.D.DATE OF BIRTH:  Dec 04, 1965   DATE OF PROCEDURE:  07/16/2007  DATE OF DISCHARGE:                               OPERATIVE REPORT   PROCEDURE:  Right knee injection.   INDICATION:  Right knee pain, articular cartilage disorder.   The pain is only partially responsive to medication management,  including narcotic analgesic medications.   An x-ray shows no acute fracture.   Informed consent was obtained after describing risks and benefits of the  procedure to the patient.  These include bleeding, bruising and  infection.  He elects to proceed and has given written consent.   The patient placed supine on exam table.   Betadine x3 to the medial knee after the area was marked and wiped with  alcohol.  Then a 25 gauge, 1.5 inch needle was used for the injection.  One mL of 40 mg/mL Depo-Medrol and 4 mL of 1% lidocaine injected.  The  patient tolerated the procedure well.  Post injection instructions  given.      Erick Colace, M.D.  Electronically Signed     AEK/MEDQ  D:  07/16/2007 11:56:24  T:  07/16/2007 12:37:26  Job:  [id number]

## 2010-08-13 NOTE — Op Note (Signed)
NAME:  Vincent Dennis, Vincent Dennis NO.:  [id number]   MEDICAL RECORD NO.:  [id number]          PATIENT TYPE:  INP   LOCATION:  2899                         FACILITY:  [facility]   PHYSICIAN:  Sharolyn Douglas, M.D.        DATE OF BIRTH:  [birth year]   DATE OF PROCEDURE:  02/02/2005  DATE OF DISCHARGE:                                 OPERATIVE REPORT   DIAGNOSIS:  1.  Left L3-4 extraforaminal disk herniation with left L3 radiculopathy.  2.  Multilevel degenerative disk disease.   PROCEDURE:  Extraforaminal left L3-4 diskectomy via the intertransverse  approach.   SURGEON:  Max, MD   ASSISTANT:  Verlin Fester, P.A.   ANESTHESIA:  General endotracheal.   ESTIMATED BLOOD LOSS:  Minimal.   COMPLICATIONS:  None.   INDICATIONS:  The patient is a pleasant 45 year old male with chronic back  and worsening left thigh pain. His MRI scan shows multilevel degenerative  changes with a large disk herniation at L3-4 on the left side extraforaminal  which compresses the left L3 nerve root. He has failed to respond to all  conservative treatment and elected to undergo extraforaminal diskectomy at  this point in hopes of improving his symptoms. Risk and benefits were  reviewed.   PROCEDURE:  The patient was identified in the holding area, taken to the  operating room underwent general endotracheal anesthesia without difficulty  given prophylactic IV antibiotics. Carefully positioned prone on the Wilson  frame. All bony prominences padded. Face eyes protected at all times. Back  prepped, draped usual sterile fashion. Fluoroscopy was brought in the field.  The L3-4 level was identified in both the AP and lateral planes. A 3 cm  incision was made over the intertransverse space on the left side.  Dissection was carried to the deep fascia. Dilators from the Maxcess  retractor system were used to spread the paraspinal muscle docking on the  transverse process of L3-L4. The Maxcess retractor was placed  with the  length 90 mm blade then gently spread dilating the muscle. The Leica  microscope was draped and brought into the field. Location was confirmed  with fluoroscopy. The intertransverse membrane was then removed. The L3  nerve root was identified being displaced dorsally by the large underlying  disk rupture.  The nerve root was gently mobilized cephalad. We could then  gain access to the disk. Annulotomy was completed.  Pituitary used to remove  the disk material. We then used an Epsteine curette to further decompress  the foramen. When we were done, we felt that the nerve root was more mobile.  We irrigated.  Hemostasis was achieved. A fat graft was placed over the L3 nerve root. The  deep fascia closed with a Vicryl suture. Subcutaneous layer closed with 2-0  Vicryl followed by Dermabond on the skin edges. The patient was turned  supine, extubated without difficulty, transferred to recovery stable  condition.      Sharolyn Douglas, M.D.  Electronically Signed     MC/MEDQ  D:  02/02/2005  T:  02/02/2005  Job:  [id number]

## 2010-08-13 NOTE — Consult Note (Signed)
NAME:  Vincent Dennis, Vincent Dennis NO.:  [id number]   MEDICAL RECORD NO.:  [id number]                   PATIENT TYPE:  EMS   LOCATION:  [facility]JO                                 FACILITY:  [facility]   PHYSICIAN:  Salvadore Farber, M.D. [facility]         DATE OF BIRTH:  1965/05/02   DATE OF CONSULTATION:  09/23/2003  DATE OF DISCHARGE:  09/23/2003                                   CONSULTATION   CONSULTING PHYSICIAN:  Salvadore Farber, M.D. [facility]   PRIMARY CARE PHYSICIAN:  [facility].   PRIMARY CARDIOLOGIST:  Rollene Rotunda, M.D.   REASON FOR CONSULTATION:  Asked by Dr. Wallace Cullens in the [facility] room  to consult regarding chest pain.   HISTORY OF PRESENT ILLNESS:  Vincent Dennis is a 45 year old gentleman who  suffered non-ST segment elevation myocardial infarction in January 2005.  Cardiac catheterization demonstrated angiographically normal coronary  arteries.  Etiology of the myocardial infarction was presumed to be coronary  vasospasm.  He had been treated with aspirin and Norvasc but has been unable  to afford the Norvasc and has been off it.  He was re-hospitalized earlier  this month with recurrent chest discomfort.  He ruled out for myocardial  infarction and was discharged home.  He also has a history of narcotic-  seeking behavior.  Today, he presents to the emergency room after developing  chest tightness, after an argument with a co-worker.  Pain was as severe as  8 out of 10 and radiated to both arms.  It was associated with some  shortness of breath but no nausea or vomiting.  He arrived in the ER with  ongoing pain which improved with sublingual nitroglycerin.  He states that  morphine provided complete relief.  He is pain free now.  He denies any  chest discomfort in the interval between hospitalizations.  He further  denies any PND, orthopnea, exertional dyspnea, edema, and claudication.   PAST MEDICAL HISTORY:  1. 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  2. Elevated triglycerides with low HDL.  3. Normal LV systolic function.   ALLERGIES:  NKDA.   CURRENT MEDICATIONS:  1. Aspirin 325 mg per day.  2. Valium 5 mg twice per day.   SOCIAL HISTORY:  The patient lives in [place] with his wife and three  children.  He quit smoking in January 2005 but continues to use oral  tobacco.  He uses marijuana.  Denies cocaine use.  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   FAMILY HISTORY:  Mother is alive at 34 with hypertension and dyslipidemia.  Father died at 70 of lung cancer in the setting of diabetes.  One half  brother had a myocardial infarction at 76.  Brother had an MI at age 56.   REVIEW OF SYSTEMS:  Remarkable for occasional palpitations, otherwise  negative in detail except as above.  PHYSICAL EXAMINATION:  GENERAL:  This is an overweight but otherwise well-  appearing man in no distress.  VITAL SIGNS:  Heart rate 82, blood pressure 148/100, temp 97.2.  Oxygen  saturation is 100% on room air.  NECK:  He has no jugular venous distention and no thyromegaly.  LUNGS:  Clear to auscultation bilaterally.  HEART:  He has a nondisplaced point-of-maximum cardiac impulse.  There is a  regular rate and rhythm without murmur, rub, or gallop.  ABDOMEN:  Soft, nondistended, and nontender.  There is no  hepatosplenomegaly.  Bowel sounds are normal.  There in no pulsatile mass.  EXTREMITIES:  Warm without clubbing, cyanosis, edema, or ulceration.  NEURO:  Alert and oriented  x 3.  Moves all four extremities normally.  SKIN:  Multiple tattoos, no rashes.   Chest x-ray is interpreted as mild chronic interstitial lung disease.  Mediastinum is normal.   Electrocardiogram demonstrates a normal sinus rhythm with J point elevation  anteriorly.  Compared with a study of May 06, 2003, there has been a  leftward shift in the axis.  Otherwise no change.  The J point elevation is   unchanged.   LABORATORY STUDIES:  Potassium 4.0. Hematocrit 48.  Point-of-care troponin  less than 0.05 x 3.  Urine drug screen positive for benzodiazepines,  otherwise negative.   IMPRESSION/RECOMMENDATIONS:  1. Chest pain.  A 45 year old gentleman with a presumed vasospastic angina     presents with recurrent chest pain after an argument in the setting of     having discontinued his Norvasc and continued to tobacco use.  Given his     negative catheterization, negative enzymes, lack of antecedent exertional     symptoms, and unchanged EKG, I do not think he has developed obstructive     coronary disease in the interim.  Rather, I think this likely represents     an episode of vasospastic angina.  We will, therefore, discharge home     with advice to start Norvasc.  The patient tells me that he has health     insurance which will make this affordable now.  I advised him regarding     smoking cessation.  2. Hypertension.  Blood pressure elevated today.  Norvasc should help with     this.  The patient is asked to follow with Dr. Antoine Poche.                                               Salvadore Farber, M.D. [facility]    WED/MEDQ  D:  09/23/2003  T:  09/23/2003  Job:  [id number]   cc:   Rollene Rotunda, M.D.   [facility]

## 2010-08-22 ENCOUNTER — Emergency Department (HOSPITAL_COMMUNITY): Payer: Medicaid Other

## 2010-08-22 ENCOUNTER — Emergency Department (HOSPITAL_COMMUNITY)
Admission: EM | Admit: 2010-08-22 | Discharge: 2010-08-22 | Disposition: A | Discharge status: Home / Self Care | Payer: Medicaid Other | Attending: Emergency Medicine | Admitting: Emergency Medicine

## 2010-08-22 DIAGNOSIS — K644 Residual hemorrhoidal skin tags: Secondary | ICD-10-CM | POA: Insufficient documentation

## 2010-08-22 DIAGNOSIS — I10 Essential (primary) hypertension: Secondary | ICD-10-CM | POA: Insufficient documentation

## 2010-08-22 DIAGNOSIS — N419 Inflammatory disease of prostate, unspecified: Secondary | ICD-10-CM | POA: Insufficient documentation

## 2010-08-22 DIAGNOSIS — F411 Generalized anxiety disorder: Secondary | ICD-10-CM | POA: Insufficient documentation

## 2010-08-22 DIAGNOSIS — K6289 Other specified diseases of anus and rectum: Secondary | ICD-10-CM | POA: Insufficient documentation

## 2010-08-22 DIAGNOSIS — K625 Hemorrhage of anus and rectum: Secondary | ICD-10-CM | POA: Insufficient documentation

## 2010-08-22 DIAGNOSIS — Z79899 Other long term (current) drug therapy: Secondary | ICD-10-CM | POA: Insufficient documentation

## 2010-08-22 LAB — DIFFERENTIAL
Basophils Absolute: 0.1 10*3/uL (ref 0.0–0.1)
Eosinophils Absolute: 0 10*3/uL (ref 0.0–0.7)
Eosinophils Relative: 0 % (ref 0–5)
Lymphocytes Relative: 19 % (ref 12–46)
Lymphs Abs: 2.8 10*3/uL (ref 0.7–4.0)
Neutrophils Relative %: 74 % (ref 43–77)

## 2010-08-22 LAB — COMPREHENSIVE METABOLIC PANEL
BUN: 16 mg/dL (ref 6–23)
CO2: 22 mEq/L (ref 19–32)
Calcium: 9.7 mg/dL (ref 8.4–10.5)
Chloride: 103 mEq/L (ref 96–112)
Creatinine, Ser: 0.93 mg/dL (ref 0.4–1.5)
GFR calc non Af Amer: >60 mL/min (ref >60)
Glucose, Bld: 110 mg/dL — ABNORMAL HIGH (ref 70–99)
Total Bilirubin: 0.8 mg/dL (ref 0.3–1.2)

## 2010-08-22 LAB — URINALYSIS, ROUTINE W REFLEX MICROSCOPIC
Glucose, UA: NEGATIVE mg/dL
Hgb urine dipstick: NEGATIVE
Ketones, ur: >80 mg/dL — AB
Protein, ur: 30 mg/dL — AB
pH: 6 (ref 5.0–8.0)

## 2010-08-22 LAB — CBC
MCV: 86 fL (ref 78.0–100.0)
Platelets: 230 10*3/uL (ref 150–400)
RBC: 5.64 MIL/uL (ref 4.22–5.81)
RDW: 12.3 % (ref 11.5–15.5)
WBC: 14.3 10*3/uL — ABNORMAL HIGH (ref 4.0–10.5)

## 2010-08-22 LAB — URINE MICROSCOPIC-ADD ON

## 2010-08-22 LAB — RAPID URINE DRUG SCREEN, HOSP PERFORMED
Benzodiazepines: POSITIVE — AB
Cocaine: NOT DETECTED
Opiates: NOT DETECTED

## 2010-08-22 LAB — ETHANOL: Alcohol, Ethyl (B): <11 mg/dL — ABNORMAL HIGH (ref 0–10)

## 2010-08-23 LAB — URINE CULTURE: Culture  Setup Time: 201205272012

## 2010-12-29 LAB — RAPID URINE DRUG SCREEN, HOSP PERFORMED
Opiates: NOT DETECTED
Tetrahydrocannabinol: NOT DETECTED

## 2010-12-29 LAB — POCT CARDIAC MARKERS
CKMB, poc: <1 — ABNORMAL LOW
Myoglobin, poc: 58.8
Myoglobin, poc: 60.2
Troponin i, poc: <0.05
Troponin i, poc: <0.05

## 2010-12-29 LAB — POCT I-STAT, CHEM 8
BUN: 14
Potassium: 4.2
Sodium: 137
TCO2: 28

## 2010-12-29 LAB — CBC
HCT: 44.9
Platelets: 223
RBC: 5.13
WBC: 8.6

## 2010-12-29 LAB — PROTIME-INR: INR: 0.9

## 2011-01-03 LAB — COMPREHENSIVE METABOLIC PANEL
Alkaline Phosphatase: 68
BUN: 16
Chloride: 100
GFR calc non Af Amer: >60
Glucose, Bld: 99
Potassium: 3.8
Total Bilirubin: 0.9

## 2011-01-03 LAB — DIFFERENTIAL
Basophils Absolute: 0.1
Basophils Relative: 1
Neutro Abs: 7
Neutrophils Relative %: 57

## 2011-01-03 LAB — CBC
HCT: 47.7
Hemoglobin: 16.1
WBC: 12.4 — ABNORMAL HIGH

## 2011-06-15 ENCOUNTER — Emergency Department (HOSPITAL_COMMUNITY)
Admission: EM | Admit: 2011-06-15 | Discharge: 2011-06-15 | Disposition: A | Discharge status: Home / Self Care | Payer: Medicaid Other | Attending: Emergency Medicine | Admitting: Emergency Medicine

## 2011-06-15 ENCOUNTER — Encounter (HOSPITAL_COMMUNITY): Payer: Self-pay | Admitting: *Deleted

## 2011-06-15 DIAGNOSIS — M545 Low back pain, unspecified: Secondary | ICD-10-CM | POA: Insufficient documentation

## 2011-06-15 DIAGNOSIS — F17200 Nicotine dependence, unspecified, uncomplicated: Secondary | ICD-10-CM | POA: Insufficient documentation

## 2011-06-15 DIAGNOSIS — G8929 Other chronic pain: Secondary | ICD-10-CM | POA: Insufficient documentation

## 2011-06-15 HISTORY — DX: Dorsalgia, unspecified: M54.9

## 2011-06-15 HISTORY — DX: Other chronic pain: G89.29

## 2011-06-15 MED ORDER — HYDROMORPHONE HCL PF 2 MG/ML IJ SOLN
2.0000 mg | Freq: Once | INTRAMUSCULAR | Status: AC
  Administered 2011-06-15: 2 mg via INTRAMUSCULAR
  Filled 2011-06-15: qty 1

## 2011-06-15 MED ORDER — MELOXICAM 7.5 MG PO TABS: 7.5000 mg | ORAL_TABLET | Freq: Two times a day (BID) | ORAL | Status: DC

## 2011-06-15 MED ORDER — ONDANSETRON 4 MG PO TBDP
4.0000 mg | ORAL_TABLET | Freq: Once | ORAL | Status: AC
  Administered 2011-06-15: 4 mg via ORAL
  Filled 2011-06-15: qty 1

## 2011-06-15 MED ORDER — OXYCODONE HCL 5 MG PO TABS: 5.0000 mg | ORAL_TABLET | ORAL | Status: AC | PRN

## 2011-06-15 NOTE — ED Provider Notes (Signed)
Medical screening examination/treatment/procedure(s) were performed by non-physician practitioner and as supervising physician I was immediately available for consultation/collaboration.   Andrew King, MD 06/15/11 0647 

## 2011-06-15 NOTE — Discharge Instructions (Signed)
I be written a prescription for oxycodone with gout.  Tylenol, you can add one to 2 tablets of Tylenol, along with the oxycodone as needed for increased pain, relief.  I've also written a prescription for Mobic please take as directed, one tablet twice a day.  This is an anti-inflammatory, and may also help your pain please do not take the oxycodone and hydrocodone at the same time.  Please make sure to keep your appointment with Dr. Mayford Knife as scheduled on March 29

## 2011-06-15 NOTE — ED Notes (Signed)
The pt has back pain for months or years.  He has had more pain  For 2 weeks.  He fell yesterday and  Had difficulty moving his legs.

## 2011-06-15 NOTE — ED Provider Notes (Signed)
History     CSN: [id number]  Arrival date & time 06/15/11  0250   First MD Initiated Contact with Patient 06/15/11 0534      Chief Complaint  Patient presents with  . Back Pain    (Consider location/radiation/quality/duration/timing/severity/associated sxs/prior treatment) HPI Comments: Vincent Dennis is a gentleman, who has chronic back pain.  He has had surgery to repair this and it has not helped.  His last surgery was in 2004.  He has been on chronic pain medication since then.  He has weaned himself down from 120 Percocets per month to 90 Norpace per month.  He does report that he had a fall on March 3 landing on his left thigh bruised.  Is getting better, but the pain in his back tonight was unbearable and uncontrolled with his home medication.  He states he has not been sleeping for the last few nights.  He is requesting temporary pain relief until he can see Vincent Dennis as scheduled on March 29 his family is at the bedside with him and agreed that this is indeed true  Patient is a 46 y.o. male presenting with back pain. The history is provided by the patient.  Back Pain  This is a chronic problem. The current episode started more than 1 week ago. The problem occurs constantly. The problem has been gradually worsening. The pain is associated with falling. The pain is present in the lumbar spine. The quality of the pain is described as shooting. The pain radiates to the left thigh. The pain is at a severity of 8/10. The pain is severe. The symptoms are aggravated by certain positions. Associated symptoms include weakness. Pertinent negatives include no fever, no numbness and no dysuria.    Past Medical History  Diagnosis Date  . Chronic back pain     History reviewed. No pertinent past surgical history.  No family history on file.  History  Substance Use Topics  . Smoking status: Current Everyday Smoker  . Smokeless tobacco: Not on file  . Alcohol Use: No       Review of Systems  Constitutional: Negative for fever.  Respiratory: Negative for shortness of breath.   Cardiovascular: Negative for leg swelling.  Genitourinary: Negative for dysuria, frequency, flank pain and decreased urine volume.  Musculoskeletal: Positive for back pain.  Neurological: Positive for weakness. Negative for dizziness and numbness.    Allergies  Review of patient's allergies indicates no known allergies.  Home Medications   Current Outpatient Rx  Name Route Sig Dispense Refill  . ALPRAZOLAM 1 MG PO TABS Oral Take 1 mg by mouth 3 (three) times daily as needed. For anxiety    . BUDESONIDE-FORMOTEROL FUMARATE 160-4.5 MCG/ACT IN AERO Inhalation Inhale 2 puffs into the lungs 2 (two) times daily.    Marland Kitchen CALCIUM CITRATE 950 MG PO TABS Oral Take 1 tablet by mouth daily.    Marland Kitchen HYDROCODONE-ACETAMINOPHEN 10-325 MG PO TABS Oral Take 1 tablet by mouth every 6 (six) hours as needed. For pain    . SUMATRIPTAN SUCCINATE 100 MG PO TABS Oral Take 100 mg by mouth every 2 (two) hours as needed. For headache    . SUMATRIPTAN SUCCINATE 6 MG/0.5ML [place] SOLN Subcutaneous Inject 6 mg into the skin every 2 (two) hours as needed. For headache    . ZOLPIDEM TARTRATE 10 MG PO TABS Oral Take 10 mg by mouth at bedtime as needed. For sleeping      BP 126/106  Pulse 75  Temp(Src) 98.7 F (37.1 C) (Oral)  Resp 20  SpO2 98%  Physical Exam  Constitutional: He appears well-developed and well-nourished.  HENT:  Head: Normocephalic.  Eyes: Pupils are equal, round, and reactive to light.  Neck: Normal range of motion.  Cardiovascular: Normal rate.   Pulmonary/Chest: Effort normal.  Musculoskeletal: He exhibits no edema and no tenderness.       Lumbar back: He exhibits tenderness. He exhibits no edema and no spasm.    ED Course  Procedures (including critical care time)  Labs Reviewed - No data to display No results found.   1. Exacerbation of chronic back pain       MDM  She  is having acute exacerbation of his chronic low back pain.  He does have an appointment with Vincent Dennis for better pain management on March 29.  His is requesting something in the emergency department tonight, so that he can get some rest and some relief.  We discussed anti-inflammatory or nonsteroidal medicines, which is not currently taking.  He is willing to try this.  I also written him for oxycodone plane without instructed him that he can have one to 2 Tylenol up to 6 tablets in a 24-hour period to aid in his pain relief as well as taking meloxicam        Arman Filter, NP 06/15/11 0548  Arman Filter, NP 06/15/11 [phone]

## 2011-08-16 ENCOUNTER — Emergency Department (HOSPITAL_COMMUNITY): Payer: Medicaid Other

## 2011-08-16 ENCOUNTER — Encounter (HOSPITAL_COMMUNITY): Payer: Self-pay

## 2011-08-16 ENCOUNTER — Inpatient Hospital Stay (HOSPITAL_COMMUNITY)
Admission: EM | Admit: 2011-08-16 | Discharge: 2011-08-19 | DRG: 093 | Disposition: A | Discharge status: Home / Self Care | Payer: Medicaid Other | Attending: Internal Medicine | Admitting: Internal Medicine

## 2011-08-16 ENCOUNTER — Inpatient Hospital Stay (HOSPITAL_COMMUNITY): Payer: Medicaid Other

## 2011-08-16 DIAGNOSIS — R29898 Other symptoms and signs involving the musculoskeletal system: Secondary | ICD-10-CM | POA: Diagnosis present

## 2011-08-16 DIAGNOSIS — F419 Anxiety disorder, unspecified: Secondary | ICD-10-CM | POA: Diagnosis present

## 2011-08-16 DIAGNOSIS — R519 Headache, unspecified: Secondary | ICD-10-CM | POA: Diagnosis present

## 2011-08-16 DIAGNOSIS — J438 Other emphysema: Secondary | ICD-10-CM

## 2011-08-16 DIAGNOSIS — R51 Headache: Secondary | ICD-10-CM | POA: Diagnosis present

## 2011-08-16 DIAGNOSIS — I252 Old myocardial infarction: Secondary | ICD-10-CM

## 2011-08-16 DIAGNOSIS — F411 Generalized anxiety disorder: Secondary | ICD-10-CM | POA: Diagnosis present

## 2011-08-16 DIAGNOSIS — G459 Transient cerebral ischemic attack, unspecified: Secondary | ICD-10-CM

## 2011-08-16 DIAGNOSIS — M549 Dorsalgia, unspecified: Secondary | ICD-10-CM | POA: Diagnosis present

## 2011-08-16 DIAGNOSIS — E785 Hyperlipidemia, unspecified: Secondary | ICD-10-CM | POA: Diagnosis present

## 2011-08-16 DIAGNOSIS — M545 Low back pain, unspecified: Secondary | ICD-10-CM

## 2011-08-16 DIAGNOSIS — Z7982 Long term (current) use of aspirin: Secondary | ICD-10-CM

## 2011-08-16 DIAGNOSIS — F329 Major depressive disorder, single episode, unspecified: Secondary | ICD-10-CM | POA: Diagnosis present

## 2011-08-16 DIAGNOSIS — M129 Arthropathy, unspecified: Secondary | ICD-10-CM | POA: Diagnosis present

## 2011-08-16 DIAGNOSIS — F3289 Other specified depressive episodes: Secondary | ICD-10-CM | POA: Diagnosis present

## 2011-08-16 DIAGNOSIS — R531 Weakness: Secondary | ICD-10-CM

## 2011-08-16 DIAGNOSIS — R471 Dysarthria and anarthria: Principal | ICD-10-CM | POA: Diagnosis present

## 2011-08-16 DIAGNOSIS — R200 Anesthesia of skin: Secondary | ICD-10-CM

## 2011-08-16 DIAGNOSIS — R209 Unspecified disturbances of skin sensation: Secondary | ICD-10-CM | POA: Diagnosis present

## 2011-08-16 DIAGNOSIS — G8929 Other chronic pain: Secondary | ICD-10-CM | POA: Diagnosis present

## 2011-08-16 DIAGNOSIS — J449 Chronic obstructive pulmonary disease, unspecified: Secondary | ICD-10-CM | POA: Diagnosis present

## 2011-08-16 DIAGNOSIS — Z79899 Other long term (current) drug therapy: Secondary | ICD-10-CM

## 2011-08-16 DIAGNOSIS — E781 Pure hyperglyceridemia: Secondary | ICD-10-CM | POA: Diagnosis present

## 2011-08-16 DIAGNOSIS — F17200 Nicotine dependence, unspecified, uncomplicated: Secondary | ICD-10-CM | POA: Diagnosis present

## 2011-08-16 DIAGNOSIS — K219 Gastro-esophageal reflux disease without esophagitis: Secondary | ICD-10-CM | POA: Diagnosis present

## 2011-08-16 DIAGNOSIS — I10 Essential (primary) hypertension: Secondary | ICD-10-CM | POA: Diagnosis present

## 2011-08-16 DIAGNOSIS — J4489 Other specified chronic obstructive pulmonary disease: Secondary | ICD-10-CM | POA: Diagnosis present

## 2011-08-16 HISTORY — DX: Acute upper respiratory infection, unspecified: J06.9

## 2011-08-16 HISTORY — DX: Acute myocardial infarction, unspecified: I21.9

## 2011-08-16 HISTORY — DX: Personal history of other diseases of the digestive system: Z87.19

## 2011-08-16 HISTORY — DX: Chronic obstructive pulmonary disease, unspecified: J44.9

## 2011-08-16 HISTORY — DX: Anxiety disorder, unspecified: F41.9

## 2011-08-16 HISTORY — DX: Mental disorder, not otherwise specified: F99

## 2011-08-16 HISTORY — DX: Unspecified osteoarthritis, unspecified site: M19.90

## 2011-08-16 HISTORY — DX: Gastro-esophageal reflux disease without esophagitis: K21.9

## 2011-08-16 HISTORY — DX: Angina pectoris, unspecified: I20.9

## 2011-08-16 HISTORY — DX: Depression, unspecified: F32.A

## 2011-08-16 HISTORY — DX: Headache: R51

## 2011-08-16 HISTORY — DX: Shortness of breath: R06.02

## 2011-08-16 HISTORY — DX: Major depressive disorder, single episode, unspecified: F32.9

## 2011-08-16 LAB — COMPREHENSIVE METABOLIC PANEL
Albumin: 4.2 g/dL (ref 3.5–5.2)
BUN: 10 mg/dL (ref 6–23)
CO2: 24 mEq/L (ref 19–32)
Chloride: 102 mEq/L (ref 96–112)
Creatinine, Ser: 0.99 mg/dL (ref 0.50–1.35)
GFR calc Af Amer: >90 mL/min (ref >90)
GFR calc non Af Amer: >90 mL/min (ref >90)
Glucose, Bld: 96 mg/dL (ref 70–99)
Total Bilirubin: 0.5 mg/dL (ref 0.3–1.2)

## 2011-08-16 LAB — POCT I-STAT, CHEM 8
BUN: 10 mg/dL (ref 6–23)
Calcium, Ion: 1.18 mmol/L (ref 1.12–1.32)
Creatinine, Ser: 1.2 mg/dL (ref 0.50–1.35)
Glucose, Bld: 99 mg/dL (ref 70–99)
Hemoglobin: 16.3 g/dL (ref 13.0–17.0)
TCO2: 24 mmol/L (ref 0–100)

## 2011-08-16 LAB — CBC
HCT: 46.6 % (ref 39.0–52.0)
Hemoglobin: 16.2 g/dL (ref 13.0–17.0)
MCHC: 34.8 g/dL (ref 30.0–36.0)
MCHC: 34.8 g/dL (ref 30.0–36.0)
MCV: 87.6 fL (ref 78.0–100.0)
Platelets: 213 10*3/uL (ref 150–400)
RBC: 5.32 MIL/uL (ref 4.22–5.81)
RDW: 12.8 % (ref 11.5–15.5)
WBC: 11.7 10*3/uL — ABNORMAL HIGH (ref 4.0–10.5)

## 2011-08-16 LAB — CK TOTAL AND CKMB (NOT AT ARMC)
CK, MB: 4.6 ng/mL — ABNORMAL HIGH (ref 0.3–4.0)
Total CK: 424 U/L — ABNORMAL HIGH (ref 7–232)

## 2011-08-16 LAB — CARDIAC PANEL(CRET KIN+CKTOT+MB+TROPI)
Relative Index: 1 (ref 0.0–2.5)
Troponin I: <0.3 ng/mL (ref <0.30)

## 2011-08-16 LAB — CREATININE, SERUM
GFR calc Af Amer: >90 mL/min (ref >90)
GFR calc non Af Amer: 88 mL/min — ABNORMAL LOW (ref >90)

## 2011-08-16 LAB — DIFFERENTIAL
Basophils Relative: 1 % (ref 0–1)
Lymphs Abs: 2.1 10*3/uL (ref 0.7–4.0)
Monocytes Absolute: 1.1 10*3/uL — ABNORMAL HIGH (ref 0.1–1.0)
Monocytes Relative: 8 % (ref 3–12)
Neutro Abs: 10.2 10*3/uL — ABNORMAL HIGH (ref 1.7–7.7)

## 2011-08-16 LAB — GLUCOSE, CAPILLARY

## 2011-08-16 LAB — APTT: aPTT: 32 seconds (ref 24–37)

## 2011-08-16 MED ORDER — HYDROMORPHONE HCL PF 1 MG/ML IJ SOLN
1.0000 mg | Freq: Once | INTRAMUSCULAR | Status: AC
  Administered 2011-08-16: 1 mg via INTRAVENOUS
  Filled 2011-08-16: qty 1

## 2011-08-16 MED ORDER — ASPIRIN 325 MG PO TABS
325.0000 mg | ORAL_TABLET | Freq: Every day | ORAL | Status: DC
  Administered 2011-08-17 – 2011-08-19 (×3): 325 mg via ORAL
  Filled 2011-08-16 (×3): qty 1

## 2011-08-16 MED ORDER — ZOLPIDEM TARTRATE 5 MG PO TABS
10.0000 mg | ORAL_TABLET | Freq: Every evening | ORAL | Status: DC | PRN
  Administered 2011-08-16 – 2011-08-18 (×3): 10 mg via ORAL
  Filled 2011-08-16 (×2): qty 2
  Filled 2011-08-16 (×2): qty 1

## 2011-08-16 MED ORDER — GUAIFENESIN 100 MG/5ML PO SOLN
30.0000 mL | Freq: Every evening | ORAL | Status: DC | PRN
  Filled 2011-08-16: qty 30
  Filled 2011-08-16: qty 118

## 2011-08-16 MED ORDER — NICOTINE 14 MG/24HR TD PT24
14.0000 mg | MEDICATED_PATCH | TRANSDERMAL | Status: DC
  Administered 2011-08-16 – 2011-08-18 (×3): 14 mg via TRANSDERMAL
  Filled 2011-08-16 (×4): qty 1

## 2011-08-16 MED ORDER — ACETAMINOPHEN 500 MG PO TABS
1000.0000 mg | ORAL_TABLET | Freq: Four times a day (QID) | ORAL | Status: DC | PRN
  Administered 2011-08-17 – 2011-08-18 (×2): 1000 mg via ORAL
  Filled 2011-08-16 (×2): qty 2

## 2011-08-16 MED ORDER — ALBUTEROL SULFATE (5 MG/ML) 0.5% IN NEBU: 2.5000 mg | INHALATION_SOLUTION | Freq: Four times a day (QID) | RESPIRATORY_TRACT | Status: DC | PRN

## 2011-08-16 MED ORDER — HYDROCODONE-ACETAMINOPHEN 10-325 MG PO TABS
1.0000 | ORAL_TABLET | Freq: Four times a day (QID) | ORAL | Status: DC | PRN
  Administered 2011-08-16 – 2011-08-19 (×8): 1 via ORAL
  Filled 2011-08-16 (×8): qty 1

## 2011-08-16 MED ORDER — ALPRAZOLAM 0.5 MG PO TABS
ORAL_TABLET | ORAL | Status: AC
  Filled 2011-08-16: qty 2

## 2011-08-16 MED ORDER — BUDESONIDE-FORMOTEROL FUMARATE 160-4.5 MCG/ACT IN AERO
2.0000 | INHALATION_SPRAY | Freq: Two times a day (BID) | RESPIRATORY_TRACT | Status: DC
  Administered 2011-08-16 – 2011-08-19 (×6): 2 via RESPIRATORY_TRACT
  Filled 2011-08-16: qty 6

## 2011-08-16 MED ORDER — ENOXAPARIN SODIUM 40 MG/0.4ML ~~LOC~~ SOLN
40.0000 mg | SUBCUTANEOUS | Status: DC
  Administered 2011-08-16 – 2011-08-18 (×3): 40 mg via SUBCUTANEOUS
  Filled 2011-08-16 (×4): qty 0.4

## 2011-08-16 MED ORDER — ALPRAZOLAM 0.5 MG PO TABS
1.0000 mg | ORAL_TABLET | Freq: Three times a day (TID) | ORAL | Status: DC | PRN
  Administered 2011-08-16 – 2011-08-19 (×8): 1 mg via ORAL
  Filled 2011-08-16 (×8): qty 2

## 2011-08-16 NOTE — ED Notes (Signed)
Admission md at bedside

## 2011-08-16 NOTE — Code Documentation (Signed)
Patient was at home playing his guitar with his son and started to feel funny at around 1300, and developed Right side weakness and dysarthria.  Patient's wife drove patient to [facility].  Code stroke called at 1408, Patient arrived to [facility] at 54, EDP seen at 68, Stroke team arrived at 67, CT scan at 17, Lab arrived at 66, CT read at 50.  NIHSS 6.  Cancelled at 1441.

## 2011-08-16 NOTE — ED Notes (Signed)
Pt refused MRI until he is "completely knocked out" Md made aware of same and sts he will await admitting md to see pt.

## 2011-08-16 NOTE — ED Notes (Signed)
Vincent Fleeting, MD handed lab test results

## 2011-08-16 NOTE — H&P (Signed)
PCP:  Jearld Lesch, MD, MD   DOA:  08/16/2011  2:01 PM  Chief Complaint:  Right-sided numbness and weakness  HPI: 45 years old man who was brought in to the ED today as code stroke with chief complain of right-sided numbness and weakness started around noon today. He also stated that he felt funny and had trouble speaking which was noted by his family. His symptoms improved by the time he came into the ER. However he started to complain of frontal headache. He denies any blurring of vision, neck pain or stiffness or any other complaints. Patient stated that he had 2 previous similar episodes in the past In the ED patient was evaluated by neurology, code stroke was canceled after symptoms resolved and CT head showed no acute abnormalities. He was consulted to admit for further management.  Allergies: No Known Allergies  Prior to Admission medications   Medication Sig Start Date End Date Taking? Authorizing Provider  acetaminophen (TYLENOL) 500 MG tablet Take 1,000 mg by mouth every 6 (six) hours as needed. For cough   Yes Historical Provider, MD  ALPRAZolam Prudy Feeler) 1 MG tablet Take 1 mg by mouth 3 (three) times daily as needed. For anxiety   Yes Historical Provider, MD  budesonide-formoterol (SYMBICORT) 160-4.5 MCG/ACT inhaler Inhale 2 puffs into the lungs 2 (two) times daily.   Yes Historical Provider, MD  guaifenesin (ROBITUSSIN) 100 MG/5ML syrup Take 30 mLs by mouth at bedtime as needed. For cough   Yes Historical Provider, MD  HYDROcodone-acetaminophen (NORCO) 10-325 MG per tablet Take 1 tablet by mouth every 6 (six) hours as needed. For pain   Yes Historical Provider, MD  SUMAtriptan (IMITREX) 100 MG tablet Take 100 mg by mouth every 2 (two) hours as needed. For headache   Yes Historical Provider, MD  SUMAtriptan (IMITREX) 6 MG/0.5ML SOLN injection Inject 6 mg into the skin every 2 (two) hours as needed. For headache   Yes Historical Provider, MD  zolpidem (AMBIEN) 10 MG tablet Take 10  mg by mouth at bedtime as needed. For sleeping   Yes Historical Provider, MD    Past Medical History  Diagnosis Date  . Chronic back pain   History of MI COPD Chronic back pain  No past surgical history on file.  Social History: Lives with family reports that he has been smoking about a pack a day for more than 30 years. He reports that he drink alcohol he uses marijuana occasionally.  No family history on file.  Review of Systems: As above in history of present illness Constitutional: Denies fever, chills, diaphoresis, appetite change and fatigue.  HEENT: Denies photophobia, eye pain, redness, hearing loss, ear pain, congestion, sore throat, rhinorrhea, sneezing, mouth sores, trouble swallowing, neck pain, neck stiffness and tinnitus.   Respiratory: Any cough occasional SOB, and cough, denies chest tightness,  and wheezing.   Cardiovascular: Denies chest pain, palpitations and leg swelling.  Gastrointestinal: Denies nausea, vomiting, abdominal pain, diarrhea, constipation, blood in stool and abdominal distention.  Genitourinary: Denies dysuria, urgency, frequency, hematuria, flank pain and difficulty urinating.  Musculoskeletal: Complain of occasional back pain is chronic Neurological: Denies right-sided numbness and weakness, headache. Denies dizziness, seizures, syncope, light-headedness Psychiatric/Behavioral: Occasional anxiety attacks   Physical Exam:  Filed Vitals:   08/16/11 1715 08/16/11 1730 08/16/11 1745 08/16/11 1800  BP: 130/81 132/72 124/67 128/77  Pulse: 75 71 67 72  Temp:      TempSrc:      Resp: 23 21 15  18  SpO2: 95% 97% 97% 97%    Constitutional: Vital signs reviewed.  Patient is a well-developed and well-nourished in no acute distress and cooperative with exam. Alert and oriented x3.  Eyes: PERRL, EOMI, conjunctivae normal, No scleral icterus.  Neck: Supple, Trachea midline normal ROM, No JVD, mass, thyromegaly, or carotid bruit present.    Cardiovascular: RRR, S1 normal, S2 normal, no MRG, pulses symmetric and intact bilaterally Pulmonary/Chest: CTAB, no wheezes, rales, or rhonchi Abdominal: Soft. Non-tender, non-distended, bowel sounds are normal, no masses, organomegaly, or guarding present.  Ext: no edema and no cyanosis, pulses palpable bilaterally (DP and PT) Neurological: A&O x3, Strenght is normal and symmetric bilaterally, cranial nerve II-XII are grossly intact, no focal motor deficit, sensory intact to light touch bilaterally.    Labs on Admission:  Results for orders placed during the hospital encounter of 08/16/11 (from the past 48 hour(s))  PROTIME-INR     Status: Normal   Collection Time   08/16/11  2:19 PM      Component Value Range Comment   Prothrombin Time 13.3  11.6 - 15.2 (seconds)    INR 0.99  0.00 - 1.49    APTT     Status: Normal   Collection Time   08/16/11  2:19 PM      Component Value Range Comment   aPTT 32  24 - 37 (seconds)   CBC     Status: Abnormal   Collection Time   08/16/11  2:19 PM      Component Value Range Comment   WBC 13.7 (*) 4.0 - 10.5 (K/uL)    RBC 5.32  4.22 - 5.81 (MIL/uL)    Hemoglobin 16.2  13.0 - 17.0 (g/dL)    HCT 29.5  62.1 - 30.8 (%)    MCV 87.6  78.0 - 100.0 (fL)    MCH 30.5  26.0 - 34.0 (pg)    MCHC 34.8  30.0 - 36.0 (g/dL)    RDW 65.7  84.6 - 96.2 (%)    Platelets 217  150 - 400 (K/uL)   DIFFERENTIAL     Status: Abnormal   Collection Time   08/16/11  2:19 PM      Component Value Range Comment   Neutrophils Relative 75  43 - 77 (%)    Neutro Abs 10.2 (*) 1.7 - 7.7 (K/uL)    Lymphocytes Relative 16  12 - 46 (%)    Lymphs Abs 2.1  0.7 - 4.0 (K/uL)    Monocytes Relative 8  3 - 12 (%)    Monocytes Absolute 1.1 (*) 0.1 - 1.0 (K/uL)    Eosinophils Relative 1  0 - 5 (%)    Eosinophils Absolute 0.1  0.0 - 0.7 (K/uL)    Basophils Relative 1  0 - 1 (%)    Basophils Absolute 0.1  0.0 - 0.1 (K/uL)   COMPREHENSIVE METABOLIC PANEL     Status: Normal   Collection Time    08/16/11  2:19 PM      Component Value Range Comment   Sodium 139  135 - 145 (mEq/L)    Potassium 3.7  3.5 - 5.1 (mEq/L)    Chloride 102  96 - 112 (mEq/L)    CO2 24  19 - 32 (mEq/L)    Glucose, Bld 96  70 - 99 (mg/dL)    BUN 10  6 - 23 (mg/dL)    Creatinine, Ser 9.52  0.50 - 1.35 (mg/dL)    Calcium 9.4  8.4 -  10.5 (mg/dL)    Total Protein 7.4  6.0 - 8.3 (g/dL)    Albumin 4.2  3.5 - 5.2 (g/dL)    AST 27  0 - 37 (U/L)    ALT 26  0 - 53 (U/L)    Alkaline Phosphatase 82  39 - 117 (U/L)    Total Bilirubin 0.5  0.3 - 1.2 (mg/dL)    GFR calc non Af Amer >90  >90 (mL/min)    GFR calc Af Amer >90  >90 (mL/min)   CK TOTAL AND CKMB     Status: Abnormal   Collection Time   08/16/11  2:19 PM      Component Value Range Comment   Total CK 424 (*) 7 - 232 (U/L)    CK, MB 4.6 (*) 0.3 - 4.0 (ng/mL)    Relative Index 1.1  0.0 - 2.5    TROPONIN I     Status: Normal   Collection Time   08/16/11  2:19 PM      Component Value Range Comment   Troponin I <0.30  <0.30 (ng/mL)   POCT I-STAT TROPONIN I     Status: Normal   Collection Time   08/16/11  2:19 PM      Component Value Range Comment   Troponin i, poc 0.00  0.00 - 0.08 (ng/mL)    Comment 3            POCT I-STAT, CHEM 8     Status: Normal   Collection Time   08/16/11  2:21 PM      Component Value Range Comment   Sodium 143  135 - 145 (mEq/L)    Potassium 3.8  3.5 - 5.1 (mEq/L)    Chloride 109  96 - 112 (mEq/L)    BUN 10  6 - 23 (mg/dL)    Creatinine, Ser 0.98  0.50 - 1.35 (mg/dL)    Glucose, Bld 99  70 - 99 (mg/dL)    Calcium, Ion 1.19  1.12 - 1.32 (mmol/L)    TCO2 24  0 - 100 (mmol/L)    Hemoglobin 16.3  13.0 - 17.0 (g/dL)    HCT 14.7  82.9 - 56.2 (%)   GLUCOSE, CAPILLARY     Status: Abnormal   Collection Time   08/16/11  2:29 PM      Component Value Range Comment   Glucose-Capillary 103 (*) 70 - 99 (mg/dL)    Comment 1 Documented in Chart      Comment 2 Notify RN       Radiological Exams on Admission: Ct Head Wo  Contrast  08/16/2011  *RADIOLOGY REPORT*  Clinical Data: Slurred speech with right-sided weakness.  Code stroke.  CT HEAD WITHOUT CONTRAST  Technique:  Contiguous axial images were obtained from the base of the skull through the vertex without contrast.  Comparison: Head CT 08/23/2009.  MRI the brain 08/22/2010.  Findings: The patient's head is tilted in the CT gantry.  There is no evidence of acute intracranial hemorrhage, mass lesion, brain edema or extra-axial fluid collection.  The ventricles and subarachnoid spaces are appropriately sized for age.  There is no CT evidence of acute cortical infarction.  The middle cerebral arteries appear symmetric allowing for patient rotation.  The visualized paranasal sinuses are clear. The calvarium is intact.  IMPRESSION: Stable examination.  No evidence of acute hemorrhage or stroke.  These results were called by telephone on 08/16/2011  at  1428 hours to  Dr. Deretha Emory, who verbally  acknowledged these results.  Original Report Authenticated By: Gerrianne Scale, M.D.    Assessment/Plan Principal Problem:  *TIA (transient ischemic attack) Active Problems:  COPD (chronic obstructive pulmonary disease)  Back pain, chronic  Headache  History of MI (myocardial infarction)  Anxiety  Plan: -Admit to telemetry /Neurology floor -TIA protocol, MRI brain/MRA/2 D. echocardiogram/carotid duplex ordered -Patient will need Ativan prior to MRI and if unable to tolerate consider conscious sedation -Aspirin 325 mg daily, PT/OT/SL P. Evaluation -Patient was seen by neurology, consult note is pending we'll follow recommendations -Continue other medication for his chronic medical problems -DVT prophylaxis -Full Code  Time Spent on Admission: Approximately 45 minutes  Zacaria Pousson 08/16/2011, 6:32 PM

## 2011-08-16 NOTE — ED Notes (Signed)
Code stroke cancelled 

## 2011-08-16 NOTE — ED Notes (Addendum)
Pt presents to department for evaluation of L sided weakness and slurred speech. Onset 13:00 this afternoon while at home. Pt states he started feeling funny around that time. Upon arrival he is having dysarthria, weakness to R side of body, and is confused.

## 2011-08-16 NOTE — ED Notes (Addendum)
CODE STROKE called @ 14:08 per Dr. Deretha Emory, pt taken directly to CT scan.

## 2011-08-16 NOTE — ED Notes (Signed)
Floor refused to take pt after report was called. sts he needs to go to neuro, pt has 99 bed on [address].

## 2011-08-16 NOTE — ED Notes (Signed)
Patient arrived to CT 

## 2011-08-16 NOTE — ED Provider Notes (Signed)
History     CSN: [id number]  Arrival date & time 08/16/11  1401   First MD Initiated Contact with Patient 08/16/11 1412      Chief Complaint  Patient presents with  . Cerebrovascular Accident  . Extremity Weakness    (Consider location/radiation/quality/duration/timing/severity/associated sxs/prior treatment) Patient is a 46 y.o. male presenting with Acute Neurological Problem and extremity weakness. The history is provided by the patient.  Cerebrovascular Accident  Extremity Weakness  He noted onset about 2 hours ago of numbness in the right side of his body. He's had a slight headache. Is not aware of any weakness. Symptoms are moderate. He denies chest pain, dyspnea, nausea. Nothing makes it better nothing makes it worse.  Past Medical History  Diagnosis Date  . Chronic back pain     No past surgical history on file.  No family history on file.  History  Substance Use Topics  . Smoking status: Current Everyday Smoker  . Smokeless tobacco: Not on file  . Alcohol Use: No      Review of Systems  Musculoskeletal: Positive for extremity weakness.  All other systems reviewed and are negative.    Allergies  Review of patient's allergies indicates no known allergies.  Home Medications   Current Outpatient Rx  Name Route Sig Dispense Refill  . ALPRAZOLAM 1 MG PO TABS Oral Take 1 mg by mouth 3 (three) times daily as needed. For anxiety    . BUDESONIDE-FORMOTEROL FUMARATE 160-4.5 MCG/ACT IN AERO Inhalation Inhale 2 puffs into the lungs 2 (two) times daily.    Marland Kitchen CALCIUM CITRATE 950 MG PO TABS Oral Take 1 tablet by mouth daily.    Marland Kitchen HYDROCODONE-ACETAMINOPHEN 10-325 MG PO TABS Oral Take 1 tablet by mouth every 6 (six) hours as needed. For pain    . MELOXICAM 7.5 MG PO TABS Oral Take 1 tablet (7.5 mg total) by mouth 2 (two) times daily. 40 tablet 0  . SUMATRIPTAN SUCCINATE 100 MG PO TABS Oral Take 100 mg by mouth every 2 (two) hours as needed. For headache    .  SUMATRIPTAN SUCCINATE 6 MG/0.5ML [place] SOLN Subcutaneous Inject 6 mg into the skin every 2 (two) hours as needed. For headache    . ZOLPIDEM TARTRATE 10 MG PO TABS Oral Take 10 mg by mouth at bedtime as needed. For sleeping      BP 141/96  Pulse 86  Temp(Src) 98 F (36.7 C) (Oral)  Resp 20  SpO2 98%  Physical Exam  Nursing note and vitals reviewed.  45 year old male who appears somewhat anxious and is in no acute distress. Vital signs are significant for mild hypertension with blood pressure 150/95. Oxygen saturation is 94% which is normal. Head is normocephalic and atraumatic. PERRLA, EOMI. As a very questionable of right facial droop. Neck is nontender and supple without adenopathy or bruit. Lungs are clear without rales, wheezes, rhonchi. Heart has regular rate and rhythm without murmur. Abdomen is soft, flat, nontender without masses or hepatomegaly. Extremities have full range of motion, no cyanosis or edema. Skin is warm and dry without rash. Neurologic: He is awake, alert, oriented. Speech is somewhat stuttering but content is normal. He has very mild dysarthria. Cranial nerves are significant for the questionable facial droop noted above. He has some moderate weakness of his right arm with strength 3/5 and mild weakness of his right leg with strength 4/5. There is right arm pronator drift. He has decreased pinprick sensation on the right side of his body  including face but there is some inconsistency with exam. He says inconsistent extinction on double simultaneous stimulation.  ED Course  Procedures (including critical care time)  Labs Reviewed  CBC - Abnormal; Notable for the following:    WBC 13.7 (*)    All other components within normal limits  DIFFERENTIAL - Abnormal; Notable for the following:    Neutro Abs 10.2 (*)    Monocytes Absolute 1.1 (*)    All other components within normal limits  POCT I-STAT, CHEM 8  POCT I-STAT TROPONIN I  PROTIME-INR  APTT  COMPREHENSIVE  METABOLIC PANEL  CK TOTAL AND CKMB  TROPONIN I   Ct Head Wo Contrast  08/16/2011  *RADIOLOGY REPORT*  Clinical Data: Slurred speech with right-sided weakness.  Code stroke.  CT HEAD WITHOUT CONTRAST  Technique:  Contiguous axial images were obtained from the base of the skull through the vertex without contrast.  Comparison: Head CT 08/23/2009.  MRI the brain 08/22/2010.  Findings: The patient's head is tilted in the CT gantry.  There is no evidence of acute intracranial hemorrhage, mass lesion, brain edema or extra-axial fluid collection.  The ventricles and subarachnoid spaces are appropriately sized for age.  There is no CT evidence of acute cortical infarction.  The middle cerebral arteries appear symmetric allowing for patient rotation.  The visualized paranasal sinuses are clear. The calvarium is intact.  IMPRESSION: Stable examination.  No evidence of acute hemorrhage or stroke.  These results were called by telephone on 08/16/2011  at  1428 hours to  Dr. Deretha Emory, who verbally acknowledged these results.  Original Report Authenticated By: Gerrianne Scale, M.D.    Date: 08/16/2011  Rate: 93  Rhythm: normal sinus rhythm  QRS Axis: normal  Intervals: normal  ST/T Wave abnormalities: normal  Conduction Disutrbances:none  Narrative Interpretation: Right atrial hypertrophy, left atrial hypertrophy. When compared with ECG of 10/14/2009, no significant changes are seen.  Old EKG Reviewed: unchanged     No diagnosis found.  CRITICAL CARE Performed by: Dione Booze   Total critical care time: 40 minutes  Critical care time was exclusive of separately billable procedures and treating other patients.  Critical care was necessary to treat or prevent imminent or life-threatening deterioration.  Critical care was time spent personally by me on the following activities: development of treatment plan with patient and/or surrogate as well as nursing, discussions with consultants, evaluation  of patient's response to treatment, examination of patient, obtaining history from patient or surrogate, ordering and performing treatments and interventions, ordering and review of laboratory studies, ordering and review of radiographic studies, pulse oximetry and re-evaluation of patient's condition.   MDM  Possible stroke. Case is been evaluated by the stroke team he did not feel that he is a code stroke.  Reevaluation at 1620: The patient is improved but he is still slightly confused and speech is still slightly dysarthric. Family is with him and apparently has had 2 similar episodes in the past. He has had an MRI in the past and old charts were reviewed x-rays have essentially negative MR I. one year ago. He did not have an MRA, carotid duplex scan, or echocardiogram. Since he has not returned to baseline, will admit for further evaluation and neuro checks.  Case is discussed with Dr. Cleotis Lema who agrees to admit the patient to    Dione Booze, MD 08/16/11 1659

## 2011-08-17 ENCOUNTER — Inpatient Hospital Stay (HOSPITAL_COMMUNITY): Payer: Medicaid Other

## 2011-08-17 DIAGNOSIS — M545 Low back pain, unspecified: Secondary | ICD-10-CM

## 2011-08-17 DIAGNOSIS — F17200 Nicotine dependence, unspecified, uncomplicated: Secondary | ICD-10-CM

## 2011-08-17 DIAGNOSIS — J438 Other emphysema: Secondary | ICD-10-CM

## 2011-08-17 DIAGNOSIS — G459 Transient cerebral ischemic attack, unspecified: Secondary | ICD-10-CM

## 2011-08-17 LAB — RAPID URINE DRUG SCREEN, HOSP PERFORMED
Barbiturates: NOT DETECTED
Benzodiazepines: POSITIVE — AB
Cocaine: NOT DETECTED

## 2011-08-17 LAB — LIPID PANEL
Cholesterol: 195 mg/dL (ref 0–200)
LDL Cholesterol: 109 mg/dL — ABNORMAL HIGH (ref 0–99)
Total CHOL/HDL Ratio: 7 RATIO
VLDL: 58 mg/dL — ABNORMAL HIGH (ref 0–40)

## 2011-08-17 LAB — CARDIAC PANEL(CRET KIN+CKTOT+MB+TROPI)
CK, MB: 3.3 ng/mL (ref 0.3–4.0)
Relative Index: 1 (ref 0.0–2.5)
Troponin I: <0.3 ng/mL (ref <0.30)

## 2011-08-17 LAB — GLUCOSE, CAPILLARY: Glucose-Capillary: 124 mg/dL — ABNORMAL HIGH (ref 70–99)

## 2011-08-17 MED ORDER — GUAIFENESIN 100 MG/5ML PO SOLN
10.0000 mL | ORAL | Status: DC
  Administered 2011-08-17 – 2011-08-18 (×7): 200 mg via ORAL
  Filled 2011-08-17 (×17): qty 10

## 2011-08-17 MED ORDER — SIMVASTATIN 20 MG PO TABS
20.0000 mg | ORAL_TABLET | Freq: Every day | ORAL | Status: DC
  Administered 2011-08-17 – 2011-08-18 (×2): 20 mg via ORAL
  Filled 2011-08-17 (×5): qty 1

## 2011-08-17 MED ORDER — LORAZEPAM 2 MG/ML IJ SOLN: 4.0000 mg | Freq: Once | INTRAMUSCULAR | Status: DC

## 2011-08-17 NOTE — Progress Notes (Signed)
Pt attempted MRI scans after given Ativan, pt states he takes this medication all the time and he's always had to be IV sedation with previous scans.  Even with this, pt attempted, was put in scanner and he had major panic sxs.  Pt removed, sent back to floor

## 2011-08-17 NOTE — Progress Notes (Signed)
Subjective: Patient seen and examined this morning. C/o persistent  cough. His speech has improved but still feels some ?word finding difficulty. denies any rt sided numbness  Objective:  Vital signs in last 24 hours:  Filed Vitals:   08/17/11 0200 08/17/11 0400 08/17/11 0600 08/17/11 1200  BP: 113/51 122/78 125/73 122/75  Pulse: 60 69 64 65  Temp: 98 F (36.7 C) 97.5 F (36.4 C) 97.8 F (36.6 C) 98.2 F (36.8 C)  TempSrc:    Oral  Resp: 16 18 18 18   SpO2: 95% 97% 98% 95%    Intake/Output from previous day:   Intake/Output Summary (Last 24 hours) at 08/17/11 1508 Last data filed at 08/16/11 2100  Gross per 24 hour  Intake    480 ml  Output    300 ml  Net    180 ml    Physical Exam:  General:middle aged  in no acute distress. HEENT: no pallor, no icterus, moist oral mucosa, no JVD, no lymphadenopathy Heart: Normal  s1 &s2  Regular rate and rhythm, without murmurs, rubs, gallops. Lungs: Clear to auscultation bilaterally. Abdomen: Soft, nontender, nondistended, positive bowel sounds. Extremities: No clubbing cyanosis or edema with positive pedal pulses. Neuro: Alert, awake, oriented x3, nonfocal.   Lab Results:  Basic Metabolic Panel:    Component Value Date/Time   NA 143 08/16/2011 1421   K 3.8 08/16/2011 1421   CL 109 08/16/2011 1421   CO2 24 08/16/2011 1419   BUN 10 08/16/2011 1421   CREATININE 1.00 08/16/2011 2035   GLUCOSE 99 08/16/2011 1421   CALCIUM 9.4 08/16/2011 1419   CBC:    Component Value Date/Time   WBC 11.7* 08/16/2011 2035   HGB 15.7 08/16/2011 2035   HCT 45.1 08/16/2011 2035   PLT 213 08/16/2011 2035   MCV 87.6 08/16/2011 2035   NEUTROABS 10.2* 08/16/2011 1419   LYMPHSABS 2.1 08/16/2011 1419   MONOABS 1.1* 08/16/2011 1419   EOSABS 0.1 08/16/2011 1419   BASOSABS 0.1 08/16/2011 1419    No results found for this or any previous visit (from the past 240 hour(s)).  Studies/Results: Ct Head Wo Contrast  08/16/2011  *RADIOLOGY REPORT*  Clinical Data:  Slurred speech with right-sided weakness.  Code stroke.  CT HEAD WITHOUT CONTRAST  Technique:  Contiguous axial images were obtained from the base of the skull through the vertex without contrast.  Comparison: Head CT 08/23/2009.  MRI the brain 08/22/2010.  Findings: The patient's head is tilted in the CT gantry.  There is no evidence of acute intracranial hemorrhage, mass lesion, brain edema or extra-axial fluid collection.  The ventricles and subarachnoid spaces are appropriately sized for age.  There is no CT evidence of acute cortical infarction.  The middle cerebral arteries appear symmetric allowing for patient rotation.  The visualized paranasal sinuses are clear. The calvarium is intact.  IMPRESSION: Stable examination.  No evidence of acute hemorrhage or stroke.  These results were called by telephone on 08/16/2011  at  1428 hours to  Dr. Deretha Emory, who verbally acknowledged these results.  Original Report Authenticated By: Gerrianne Scale, M.D.    Medications: Scheduled Meds:   . ALPRAZolam      . aspirin  325 mg Oral Daily  . budesonide-formoterol  2 puff Inhalation BID  . enoxaparin  40 mg Subcutaneous Q24H  . guaiFENesin  10 mL Oral Q4H  .  HYDROmorphone (DILAUDID) injection  1 mg Intravenous Once  . nicotine  14 mg Transdermal Q24H  Continuous Infusions:  PRN Meds:.acetaminophen, albuterol, ALPRAZolam, HYDROcodone-acetaminophen, zolpidem, DISCONTD: guaiFENesin  Assessment/Plan: 46 y/o male who is an active smoker , hx of COPD and CAD presented with slurry speech and rt sided weakness concerning for TIA/ CVA.     *TIA (transient ischemic attack)/ CVA Continue tele monitoring  head CT unremarkable  MRI/ MR brain, 2 decho and carotid doppler pending  check Lipid panel and A1C  counseled on smoking cessation Speech consult Neurology consult pending Cont ASA Dyslipidemia noted. Will add zocor    COPD (chronic obstructive pulmonary disease) Persistent cough on  presentation  check CXR robitussin prn   CAD with hx of MI Not on any meds  cont ASA, added statin   Tobacco abuse  smokes 1 PPD for several years  counseled strongly on smoking cessation Nicotine patch  DVT prophylaxis  Cardiac diet   Full code   LOS: 1 day   Roshaunda Starkey 08/17/2011, 3:08 PM

## 2011-08-17 NOTE — Progress Notes (Signed)
Physical Therapy Evaluation Patient Details Name: Vincent Dennis MRN: [id number] DOB: 22-Aug-1965 Today's Date: 08/17/2011 Time: 1478-2956 PT Time Calculation (min): 20 min  PT Assessment / Plan / Recommendation Clinical Impression  46 yo male admitted with TIA presents with physical and functional mobility limitations; will benefit from acute PT to maximize independence and safety with mobility/amb/steps to enable safe dc home    PT Assessment  Patient needs continued PT services    Follow Up Recommendations  Outpatient PT (with focus on treating back pain)    Barriers to Discharge        lEquipment Recommendations  Rolling walker with 5" wheels;3 in 1 bedside comode    Recommendations for Other Services OT consult   Frequency Min 3X/week    Precautions / Restrictions Precautions Precautions: Fall;Back Precaution Comments: Back prec for comfort   Pertinent Vitals/Pain 7/10 back pain, which pt states is chronic      Mobility  Bed Mobility Bed Mobility: Supine to Sit;Sitting - Scoot to Edge of Bed Supine to Sit: 4: Min guard;HOB flat;With rails Sitting - Scoot to Edge of Bed: 5: Supervision;With rail Details for Bed Mobility Assistance: Cues for back prec for comfort Transfers Transfers: Sit to Stand;Stand to Sit Sit to Stand: 4: Min assist;From bed;With upper extremity assist Stand to Sit: 4: Min assist;To chair/3-in-1;With armrests;With upper extremity assist Details for Transfer Assistance: Cues for safety, hand placement, control Ambulation/Gait Ambulation/Gait Assistance: 4: Min guard Ambulation Distance (Feet): 100 Feet Assistive device: Straight cane;Rolling walker Ambulation/Gait Assistance Details: Noted pt tending to reach out for bil UE support when using straight cane; Much better steadiness with amb with RW Gait Pattern: Step-through pattern Modified Rankin (Stroke Patients Only) Pre-Morbid Rankin Score: Moderate disability Modified Rankin: Moderate  disability    Exercises     PT Diagnosis: Difficulty walking;Acute pain  PT Problem List: Decreased activity tolerance;Decreased balance;Decreased mobility;Decreased knowledge of use of DME;Pain PT Treatment Interventions: DME instruction;Gait training;Stair training;Functional mobility training;Therapeutic activities;Therapeutic exercise;Patient/family education   PT Goals Acute Rehab PT Goals PT Goal Formulation: With patient Time For Goal Achievement: 08/24/11 Potential to Achieve Goals: Good Pt will go Supine/Side to Sit: with modified independence PT Goal: Supine/Side to Sit - Progress: Goal set today Pt will go Sit to Supine/Side: with modified independence PT Goal: Sit to Supine/Side - Progress: Goal set today Pt will go Sit to Stand: with modified independence PT Goal: Sit to Stand - Progress: Goal set today Pt will go Stand to Sit: with modified independence PT Goal: Stand to Sit - Progress: Goal set today Pt will Ambulate: >150 feet;with modified independence;with rolling walker PT Goal: Ambulate - Progress: Goal set today Pt will Go Up / Down Stairs: 3-5 stairs;with rail(s);with modified independence PT Goal: Up/Down Stairs - Progress: Goal set today  Visit Information  Last PT Received On: 08/17/11 Assistance Needed: +1    Subjective Data  Subjective: Reposrt stroke symptoms are resolving; is constantly in back pain Patient Stated Goal: feel better; get to the bottom of "these episodes"   Prior Functioning  Home Living Lives With: Spouse Available Help at Discharge: Family;Available PRN/intermittently Type of Home: House Home Access: Stairs to enter [employer] of Steps: 5 Entrance Stairs-Rails: Right;Left;Can reach both Home Layout: One level Bathroom Shower/Tub: Tub/shower unit Home Adaptive Equipment: Straight cane Prior Function Level of Independence: Independent with assistive device(s) Able to Take Stairs?: Yes Driving: No ("only if  absolutely necessary") Vocation:  (not employed) Communication Communication: No difficulties    Cognition  Overall Cognitive Status: Appears within functional limits for tasks assessed/performed Arousal/Alertness: Awake/alert Orientation Level: Appears intact for tasks assessed Behavior During Session: [facility] for tasks performed    Extremity/Trunk Assessment Right Upper Extremity Assessment RUE ROM/Strength/Tone: Within functional levels Left Upper Extremity Assessment LUE ROM/Strength/Tone: Within functional levels Right Lower Extremity Assessment RLE ROM/Strength/Tone: Within functional levels Left Lower Extremity Assessment LLE ROM/Strength/Tone: Within functional levels (it seems LE motion brings on back pain)   Balance    End of Session PT - End of Session Equipment Utilized During Treatment: Gait belt Activity Tolerance: Patient limited by pain;Patient tolerated treatment well Patient left: in chair;with family/visitor present;with nursing in room Nurse Communication: Mobility status   Van Clines Devere[facility] [place], [place] [phone]  08/17/2011, 1:40 PM

## 2011-08-17 NOTE — Evaluation (Signed)
Speech Language Pathology Evaluation Patient Details Name: Vincent Dennis MRN: [id number] DOB: May 10, 1965 Today's Date: 08/17/2011 Time: 1000-1018 SLP Time Calculation (min): 18 min  Problem List:  Patient Active Problem List  Diagnoses  . TIA (transient ischemic attack)  . COPD (chronic obstructive pulmonary disease)  . Back pain, chronic  . Headache  . History of MI (myocardial infarction)  . Anxiety   Past Medical History:  Past Medical History  Diagnosis Date  . Chronic back pain   . Myocardial infarction   . Angina   . Hypertension   . COPD (chronic obstructive pulmonary disease)   . Shortness of breath   . Recurrent upper respiratory infection (URI)   . GERD (gastroesophageal reflux disease)   . H/O hiatal hernia   . Headache   . Arthritis   . Mental disorder   . Anxiety   . Depression    Past Surgical History:  Past Surgical History  Procedure Date  . Cardiac catheterization   . Hernia repair   . Back surgery   . Coronary angioplasty     Assessment / Plan / Recommendation Clinical Impression  Pt presents with minimal dysarthria with trace left lingual deviation. Pt does not demosntrate any cognitive linguisitc deficits at this time. The pt is not recommended for any f/u Speech therapy at this time and pt agrees. Reviewed signs of stroke and need for quick response. Pt verbalized understanding.     SLP Assessment  Patient does not need any further Speech Lanaguage Pathology Services    Follow Up Recommendations       Frequency and Duration        NA    SLP Goals     SLP Evaluation Prior Functioning  Cognitive/Linguistic Baseline: Within functional limits Type of Home: House Lives With: Spouse Vocation:  (not employed)   Cognition  Overall Cognitive Status: Appears within functional limits for tasks assessed Arousal/Alertness: Awake/alert Orientation Level: Oriented X4 Attention: Alternating Alternating Attention: Appears intact Memory:  Appears intact Awareness: Appears intact Problem Solving: Appears intact Safety/Judgment: Appears intact    Comprehension  Auditory Comprehension Overall Auditory Comprehension: Appears within functional limits for tasks assessed    Expression Verbal Expression Overall Verbal Expression: Appears within functional limits for tasks assessed Initiation: No impairment Level of Generative/Spontaneous Verbalization: Conversation Naming: No impairment Pragmatics: No impairment   Oral / Motor Oral Motor/Sensory Function Overall Oral Motor/Sensory Function: Impaired Labial ROM: Within Functional Limits Labial Symmetry: Within Functional Limits Labial Strength: Within Functional Limits Labial Sensation: Within Functional Limits Lingual ROM: Reduced left Lingual Symmetry: Abnormal symmetry left Lingual Strength: Reduced Facial ROM: Within Functional Limits Facial Symmetry: Within Functional Limits Facial Strength: Within Functional Limits Facial Sensation: Within Functional Limits Motor Speech Overall Motor Speech: Impaired Respiration: Within functional limits Phonation: Normal Resonance: Within functional limits Articulation: Impaired Level of Impairment: Conversation Intelligibility: Intelligibility reduced Conversation: 75-100% accurate Motor Speech Errors: Aware     Kenslie Abbruzzese, Riley Nearing 08/17/2011, 10:26 AM

## 2011-08-17 NOTE — Code Documentation (Addendum)
46 year old male who presented to the ED with c/o numbness on the R side and stuttering speech. (See earlier note by RRT for code stroke times).  CT was negative for acute abnormality.  NIHSS revealed no focal deficits except subjectively he reports numbness of the entire R side (does not split the mid-line), his R limbs hit the bed scoring 2 for both extremities on the NIHSS. However, there seems to be normal tone in all extremities.  His eyelids are twitchy, and speech is dysrrhythmic, but no real aphasia or dysarthria noted.  Pt reports he has been under lots of stress lately, and that today is the the anniversary of his father's death.  He admits to using marijuana, the most recent time was 2 days ago. Denies any other substance abuse.  Dr. Otelia Limes here evaluating the pt. Code stroke canceled since no indication for acute intervention.

## 2011-08-17 NOTE — Consult Note (Signed)
Referring Physician: Gonzella Lex, MD    Chief Complaint: slurred speech, right sided weakness.   HPI: Vincent Dennis is an 46 y.o. male admitted 08/16/11 with dysarthria and right sided numbness/weakness.  Patient was at home playing his guitar with his son and started to feel funny at around 1300 08/16/11, He states he was able to understand what was being said, but could not get his words out right. Also had difficulty using right arm and walking due to numbness and weakness on right side. His wife brought him to ER 45 min after onset of sx.  Code stroke was called, but cancelled by stroke team; suspected conversion disorder. Patient states he was still having trouble speaking this am, but is almost back to normal now.  [id number] 08/16/11 tPA Given: no, code stroke cancelled.   Past Medical History  Diagnosis Date  . Chronic back pain   . Myocardial infarction   . Angina   . Hypertension   . COPD (chronic obstructive pulmonary disease)   . Shortness of breath   . Recurrent upper respiratory infection (URI)   . GERD (gastroesophageal reflux disease)   . H/O hiatal hernia   . Headache   . Arthritis   . Mental disorder   . Anxiety   . Depression     Past Surgical History  Procedure Date  . Cardiac catheterization   . Hernia repair   . Back surgery   . Coronary angioplasty     No family history on file. Social History:  reports that he has been smoking.  He does not have any smokeless tobacco history on file. He reports that he uses illicit drugs (Marijuana). He reports that he does not drink alcohol.  Allergies: No Known Allergies  Medications: Scheduled:   . ALPRAZolam      . aspirin  325 mg Oral Daily  . budesonide-formoterol  2 puff Inhalation BID  . enoxaparin  40 mg Subcutaneous Q24H  .  HYDROmorphone (DILAUDID) injection  1 mg Intravenous Once  . nicotine  14 mg Transdermal Q24H    ROS: No fever, chills or URI.  No chest pain, shortness of breath or dizziness.  No  visual disturbance.  Did have a frontal HA yesterday which has resolved.  Has history of tension HA an migraines.  No N/V/D/C.    Physical Examination: Blood pressure 122/75, pulse 65, temperature 98.2 F (36.8 C), temperature source Oral, resp. rate 18, SpO2 95.00%. Telemetry:  Neurologic Examination: Mental Status: Alert, oriented, thought content appropriate.  Speech fluent without evidence of aphasia. Able to follow 3 step commands without difficulty. Cranial Nerves: II- Visual fields grossly intact. III/IV/VI-Extraocular movements intact.  Pupils reactive bilaterally. V/VII-Smile symmetric VIII-grossly intact IX/X-normal gag XI-bilateral shoulder shrug XII-midline tongue extension Motor: 5/5 bilaterally with normal tone and bulk Sensory: Pinprick and light touch intact throughout, bilaterally Deep Tendon Reflexes: 2+ and symmetric throughout Plantars: Downgoing bilaterally Cerebellar: Normal finger-to-nose, normal rapid alternating movements.  Basic Metabolic Panel:  Lab 08/16/11 9811 08/16/11 1421 08/16/11 1419  NA -- 143 139  K -- 3.8 3.7  CL -- 109 102  CO2 -- -- 24  GLUCOSE -- 99 96  BUN -- 10 10  CREATININE 1.00 1.20 --  CALCIUM -- -- 9.4  MG -- -- --  PHOS -- -- --   Liver Function Tests:  Lab 08/16/11 1419  AST 27  ALT 26  ALKPHOS 82  BILITOT 0.5  PROT 7.4  ALBUMIN 4.2   CBC:  Lab  08/16/11 2035 08/16/11 1421 08/16/11 1419  WBC 11.7* -- 13.7*  NEUTROABS -- -- 10.2*  HGB 15.7 16.3 --  HCT 45.1 48.0 --  MCV 87.6 -- 87.6  PLT 213 -- 217   Cardiac Enzymes:  Lab 08/17/11 0540 08/16/11 2034 08/16/11 1419  CKTOTAL 336* 366* 424*  CKMB 3.3 3.5 4.6*  CKMBINDEX -- -- --  TROPONINI <0.30 <0.30 <0.30   CBG:  Lab 08/17/11 1131 08/17/11 0733 08/16/11 2337 08/16/11 1429  GLUCAP 124* 88 120* 103*   Hemoglobin A1C:  Lab 08/16/11 2035  HGBA1C 5.7*   Fasting Lipid Panel:  Lab 08/17/11 0540  CHOL 195  HDL 28*  LDLCALC 109*  TRIG 288*  CHOLHDL  7.0  LDLDIRECT --   Coagulation:  Lab 08/16/11 1419  LABPROT 13.3  INR 0.99   Urine Drug Screen: Drugs of Abuse     Component Value Date/Time   LABOPIA NONE DETECTED 08/16/2011 2300   COCAINSCRNUR NONE DETECTED 08/16/2011 2300   LABBENZ POSITIVE* 08/16/2011 2300   AMPHETMU NONE DETECTED 08/16/2011 2300   THCU POSITIVE* 08/16/2011 2300   LABBARB NONE DETECTED 08/16/2011 2300    08/16/2011   CT HEAD WITHOUT CONTRAST    Findings: The patient's head is tilted in the CT gantry.  There is no evidence of acute intracranial hemorrhage, mass lesion, brain edema or extra-axial fluid collection.  The ventricles and subarachnoid spaces are appropriately sized for age.  There is no CT evidence of acute cortical infarction.  The middle cerebral arteries appear symmetric allowing for patient rotation.  The visualized paranasal sinuses are clear. The calvarium is intact.  IMPRESSION: Stable examination.  No evidence of acute hemorrhage or stroke.   Gerrianne Scale, M.D.   08/17/11 Carotid Doppler  Bilateral: No evidence of hemodynamically significant internal carotid artery stenosis. Vertebral artery flow is antegrade.   Assessment: 46 y.o. male with less than 20 hours of dysarthria and right-sided weakness, now essentially resolved.  Head CT negative for infarct, mass or bleed.  Patient unable to do MRI due to anxiety.  Cannot exclude TIA on basis of exam or CT alone, however exam non-localizing now.    Stroke Risk Factors - hyperlipidemia, tobacco and polysubstance abuse, history of migraines.  Recommendations: 1. MRI, MRA  of the brain without contrast with sedation 3. PT consult, OT consult, Speech consult 4. Echocardiogram 5. Prophylactic therapy- ASA 325 mg 6. Risk factor modification 7. Telemetry monitoring  Thank you for allowing [place] to assist in the management of this patient with you.  Will follow.  Discussed with Dr. Thad Ranger.   Marya Fossa PA-C [facility] [phone] 08/17/2011, 12:14 PM  Patient seen and examined. I agree with the above.  Thana Farr, MD [facility] [phone]  08/17/2011  8:16 PM

## 2011-08-17 NOTE — Consult Note (Signed)
Pt smokes 1 ppd and is in action stage. Wife also smokes and they both plan to quit. Pt unsuccessful at quitting on his own in the past. Recommended 21 mg patch to start with . dicussed patch use instructions and how to taper. Referred to 1-800 quit now for f/u and support. Discussed oral fixation substitutes, second hand smoke and in home smoking policy. Reviewed and gave pt Written education/contact information.

## 2011-08-17 NOTE — Progress Notes (Signed)
VASCULAR LAB PRELIMINARY  PRELIMINARY  PRELIMINARY  PRELIMINARY  Carotid duplex  completed.    Preliminary report:  Bilateral:  No evidence of hemodynamically significant internal carotid artery stenosis.   Vertebral artery flow is antegrade.     Terance Hart, RVT 08/17/2011, 11:54 AM

## 2011-08-18 ENCOUNTER — Inpatient Hospital Stay (HOSPITAL_COMMUNITY): Payer: Medicaid Other

## 2011-08-18 ENCOUNTER — Other Ambulatory Visit: Payer: Self-pay | Admitting: Diagnostic Radiology

## 2011-08-18 DIAGNOSIS — F411 Generalized anxiety disorder: Secondary | ICD-10-CM

## 2011-08-18 DIAGNOSIS — J438 Other emphysema: Secondary | ICD-10-CM

## 2011-08-18 DIAGNOSIS — F17200 Nicotine dependence, unspecified, uncomplicated: Secondary | ICD-10-CM

## 2011-08-18 DIAGNOSIS — G459 Transient cerebral ischemic attack, unspecified: Secondary | ICD-10-CM

## 2011-08-18 MED ORDER — MIDAZOLAM HCL 2 MG/2ML IJ SOLN: 1.0000 mg | INTRAMUSCULAR | Status: DC | PRN

## 2011-08-18 MED ORDER — MIDAZOLAM HCL 5 MG/5ML IJ SOLN
INTRAMUSCULAR | Status: AC | PRN
  Administered 2011-08-18 (×6): 2 mg via INTRAVENOUS

## 2011-08-18 MED ORDER — SODIUM CHLORIDE 0.9 % IV SOLN
INTRAVENOUS | Status: AC | PRN
  Administered 2011-08-18: 50 mL/h via INTRAVENOUS

## 2011-08-18 MED ORDER — MIDAZOLAM HCL 2 MG/2ML IJ SOLN
INTRAMUSCULAR | Status: AC
  Filled 2011-08-18: qty 20

## 2011-08-18 MED ORDER — MIDAZOLAM HCL 2 MG/2ML IJ SOLN
1.0000 mg | INTRAMUSCULAR | Status: DC | PRN
  Filled 2011-08-18: qty 20

## 2011-08-18 MED ORDER — FENTANYL CITRATE 0.05 MG/ML IJ SOLN
INTRAMUSCULAR | Status: AC
  Filled 2011-08-18: qty 6

## 2011-08-18 MED ORDER — FENTANYL CITRATE 0.05 MG/ML IJ SOLN: 25.0000 ug | INTRAMUSCULAR | Status: DC | PRN

## 2011-08-18 MED ORDER — FENTANYL CITRATE 0.05 MG/ML IJ SOLN
INTRAMUSCULAR | Status: AC | PRN
  Administered 2011-08-18 (×5): 50 ug via INTRAVENOUS

## 2011-08-18 NOTE — Progress Notes (Signed)
Subjective: patient seen and examined this morning. denies any  weakness and speech improved. Could not get MRI yday due to anxiety and sent back  Objective:  Vital signs in last 24 hours:  Filed Vitals:   08/18/11 0100 08/18/11 0400 08/18/11 0800 08/18/11 0813  BP:  134/85 109/73   Pulse:  69 70   Temp:  97.8 F (36.6 C) 97.6 F (36.4 C)   TempSrc:  Oral Oral   Resp:  20 18   Height: 6\' 2"  (1.88 m)     Weight: 124.875 kg (275 lb 4.8 oz)     SpO2:  97% 93% 95%    Intake/Output from previous day:  No intake or output data in the 24 hours ending 08/18/11 1053  Physical Exam:  General:middle aged in no acute distress.  HEENT: no pallor, no icterus, moist oral mucosa, no JVD, no lymphadenopathy  Heart: Normal s1 &s2 Regular rate and rhythm, without murmurs, rubs, gallops.  Lungs: Clear to auscultation bilaterally.  Abdomen: Soft, nontender, nondistended, positive bowel sounds.  Extremities: No clubbing cyanosis or edema with positive pedal pulses.  Neuro: Alert, awake, oriented x3, nonfocal.    Lab Results:  Basic Metabolic Panel:    Component Value Date/Time   NA 143 08/16/2011 1421   K 3.8 08/16/2011 1421   CL 109 08/16/2011 1421   CO2 24 08/16/2011 1419   BUN 10 08/16/2011 1421   CREATININE 1.00 08/16/2011 2035   GLUCOSE 99 08/16/2011 1421   CALCIUM 9.4 08/16/2011 1419   CBC:    Component Value Date/Time   WBC 11.7* 08/16/2011 2035   HGB 15.7 08/16/2011 2035   HCT 45.1 08/16/2011 2035   PLT 213 08/16/2011 2035   MCV 87.6 08/16/2011 2035   NEUTROABS 10.2* 08/16/2011 1419   LYMPHSABS 2.1 08/16/2011 1419   MONOABS 1.1* 08/16/2011 1419   EOSABS 0.1 08/16/2011 1419   BASOSABS 0.1 08/16/2011 1419    No results found for this or any previous visit (from the past 240 hour(s)).  Studies/Results: Dg Chest 2 View  08/17/2011  *RADIOLOGY REPORT*  Clinical Data: Coughing and chest congestion.  CHEST - 2 VIEW  Comparison: 10/14/2009  Findings: The lungs are clear without focal  infiltrate, edema, pneumothorax or pleural effusion. Cardiopericardial silhouette is at upper limits of normal for size. Interstitial markings are diffusely coarsened with chronic features. Imaged bony structures of the thorax are intact.  IMPRESSION: No acute cardiopulmonary findings.  Original Report Authenticated By: ERIC A. MANSELL, M.D.   Ct Head Wo Contrast  08/16/2011  *RADIOLOGY REPORT*  Clinical Data: Slurred speech with right-sided weakness.  Code stroke.  CT HEAD WITHOUT CONTRAST  Technique:  Contiguous axial images were obtained from the base of the skull through the vertex without contrast.  Comparison: Head CT 08/23/2009.  MRI the brain 08/22/2010.  Findings: The patient's head is tilted in the CT gantry.  There is no evidence of acute intracranial hemorrhage, mass lesion, brain edema or extra-axial fluid collection.  The ventricles and subarachnoid spaces are appropriately sized for age.  There is no CT evidence of acute cortical infarction.  The middle cerebral arteries appear symmetric allowing for patient rotation.  The visualized paranasal sinuses are clear. The calvarium is intact.  IMPRESSION: Stable examination.  No evidence of acute hemorrhage or stroke.  These results were called by telephone on 08/16/2011  at  1428 hours to  Dr. Deretha Emory, who verbally acknowledged these results.  Original Report Authenticated By: Gerrianne Scale, M.D.  Medications: Scheduled Meds:   . aspirin  325 mg Oral Daily  . budesonide-formoterol  2 puff Inhalation BID  . enoxaparin  40 mg Subcutaneous Q24H  . guaiFENesin  10 mL Oral Q4H  . LORazepam  4 mg Intravenous Once  . nicotine  14 mg Transdermal Q24H  . simvastatin  20 mg Oral q1800   Continuous Infusions:  PRN Meds:.acetaminophen, albuterol, ALPRAZolam, fentaNYL, HYDROcodone-acetaminophen, midazolam, zolpidem, DISCONTD: guaiFENesin   Assessment/Plan:  46 y/o male who is an active smoker , hx of COPD and CAD presented with slurry  speech and rt sided weakness concerning for TIA/ CVA.   *TIA (transient ischemic attack)/ CVA  Continue tele monitoring  head CT unremarkable  MRI/ MR brain, 2 decho pending. Carotid doppler unremarkable. Could not get MRI yday due to anxiety and recommend for conscious sedation.  check Lipid panel and A1C  counseled on smoking cessation  Speech consult  Appreciate Neurology consult  Cont ASA  Dyslipidemia noted.  added zocor   COPD (chronic obstructive pulmonary disease)  Persistent cough on presentation   CXR unremarkable robitussin prn , symptoms better now  CAD with hx of MI  Not on any meds  cont ASA, added statin   Tobacco abuse  smokes 1 PPD for several years  counseled strongly on smoking cessation  Nicotine patch   DVT prophylaxis   Cardiac diet     LOS: 2 days   Karan Inclan 08/18/2011, 10:53 AM

## 2011-08-18 NOTE — Progress Notes (Signed)
  Echocardiogram 2D Echocardiogram has been performed.  Vincent Dennis A 08/18/2011, 11:20 AM

## 2011-08-18 NOTE — Progress Notes (Signed)
Stroke Team Progress Note  HISTORY BAXTER GONZALEZ is an 46 y.o. male admitted 08/16/11 with dysarthria and right sided numbness/weakness. Patient was at home playing his guitar with his son and started to feel funny at around 1300 08/16/11, He states he was able to understand what was being said, but could not get his words out right. Also had difficulty using right arm and walking due to numbness and weakness on right side. His wife brought him to ER 45 min after onset of sx. Code stroke was called, but cancelled by stroke team; suspected conversion disorder. Patient states he was still having trouble speaking 08/17/2011 am, but is almost back to normal. Patient was not a TPA candidate secondary to non organic symptoms..   SUBJECTIVE His wife is at the bedside.  Overall he feels his condition is gradually improving. He was unable to tolerate MRI yesterday due to panic attack.he informs me he has had 4 TIAs in last year and saw me in 2011 for a stroke ibuprofen reviewed records and MRI 09/02/09 was normal.  OBJECTIVE Most recent Vital Signs: Filed Vitals:   08/18/11 0100 08/18/11 0400 08/18/11 0800 08/18/11 0813  BP:  134/85 109/73   Pulse:  69 70   Temp:  97.8 F (36.6 C) 97.6 F (36.4 C)   TempSrc:  Oral Oral   Resp:  20 18   Height: 6\' 2"  (1.88 m)     Weight: 124.875 kg (275 lb 4.8 oz)     SpO2:  97% 93% 95%   CBG (last 3)  Basename 08/17/11 1131 08/17/11 0733 08/16/11 2337  GLUCAP 124* 88 120*   Intake/Output from previous day:   IV Fluid Intake:     MEDICATIONS    . aspirin  325 mg Oral Daily  . budesonide-formoterol  2 puff Inhalation BID  . enoxaparin  40 mg Subcutaneous Q24H  . guaiFENesin  10 mL Oral Q4H  . LORazepam  4 mg Intravenous Once  . nicotine  14 mg Transdermal Q24H  . simvastatin  20 mg Oral q1800   PRN:  acetaminophen, albuterol, ALPRAZolam, HYDROcodone-acetaminophen, zolpidem, DISCONTD: guaiFENesin  Diet:  Cardiac thin liquids Activity:   Bathroom privileges  with assistance DVT Prophylaxis:  Lovenox 40 mg sq daily   CLINICALLY SIGNIFICANT STUDIES Basic Metabolic Panel:  Lab 08/16/11 4098 08/16/11 1421 08/16/11 1419  NA -- 143 139  K -- 3.8 3.7  CL -- 109 102  CO2 -- -- 24  GLUCOSE -- 99 96  BUN -- 10 10  CREATININE 1.00 1.20 --  CALCIUM -- -- 9.4  MG -- -- --  PHOS -- -- --   Liver Function Tests:  Lab 08/16/11 1419  AST 27  ALT 26  ALKPHOS 82  BILITOT 0.5  PROT 7.4  ALBUMIN 4.2   CBC:  Lab 08/16/11 2035 08/16/11 1421 08/16/11 1419  WBC 11.7* -- 13.7*  NEUTROABS -- -- 10.2*  HGB 15.7 16.3 --  HCT 45.1 48.0 --  MCV 87.6 -- 87.6  PLT 213 -- 217   Coagulation:  Lab 08/16/11 1419  LABPROT 13.3  INR 0.99   Cardiac Enzymes:  Lab 08/17/11 0540 08/16/11 2034 08/16/11 1419  CKTOTAL 336* 366* 424*  CKMB 3.3 3.5 4.6*  CKMBINDEX -- -- --  TROPONINI <0.30 <0.30 <0.30   Lipid Panel    Component Value Date/Time   CHOL 195 08/17/2011 0540   TRIG 288* 08/17/2011 0540   HDL 28* 08/17/2011 0540   CHOLHDL 7.0 08/17/2011 0540  VLDL 58* 08/17/2011 0540   LDLCALC 109* 08/17/2011 0540   HgbA1C  Lab Results  Component Value Date   HGBA1C 5.7* 08/16/2011    Urine Drug Screen:     Component Value Date/Time   LABOPIA NONE DETECTED 08/16/2011 2300   COCAINSCRNUR NONE DETECTED 08/16/2011 2300   LABBENZ POSITIVE* 08/16/2011 2300   AMPHETMU NONE DETECTED 08/16/2011 2300   THCU POSITIVE* 08/16/2011 2300   LABBARB NONE DETECTED 08/16/2011 2300    Alcohol Level: No results found for this basename: ETH:2 in the last 168 hours  CT of the brain  08/16/2011 Stable examination.  No evidence of acute hemorrhage or stroke.   MRI of the brain    MRA of the brain    2D Echocardiogram    Carotid Doppler  No internal carotid artery stenosis bilaterally. Vertebrals with antegrade flow bilaterally.   CXR  08/17/2011 No acute cardiopulmonary findings.    EKG  normal sinus rhythm.   Therapy Recommendations PT -outpatient PT, OT -  Physical Exam   Obese young caucasian male not in distress.Awake alert. Afebrile. Head is nontraumatic. Neck is supple without bruit. Hearing is normal. Cardiac exam no murmur or gallop. Lungs are clear to auscultation. Distal pulses are well felt.  Neurological exam ; Awake  Alert oriented x 3. Normal speech and language.eye movements full without nystagmus. Face symmetric. Tongue midline. Normal strength, tone, reflexes and coordination. Normal sensation. Gait deferred. He has poor and variable effort at times but can be distracted. ASSESSMENT Mr. JUANJOSE MOJICA is a 46 y.o. male with a dysarthira and right sided-weakness, now resolved. ? Etiology. On no antiplatelet prior to admission. Now on aspirin 325 mg orally every day for secondary stroke prevention.   -previous MRI under conscious sedation was negative for similar symptoms raising possibility of nonorganic etiology -hyperlipidemia, on statin  Hospital day # 2  TREATMENT/PLAN -Continue aspirin 325 mg orally every day for secondary stroke prevention. -agree with statin -discussed with Dr. Gonzella Lex, RN and Dr. Pearlean Brownie. All ok for conscious sedation for MRI/MRA. Orders placed.  Joaquin Music, ANP-BC, GNP-BC [facility] Pager: [phone] 08/18/2011 8:29 AM  Scribe for Dr. Delia Heady, Stroke Center Medical Director. He has personally reviewed chart, pertinent data, examined the patient and developed the plan of care. Pager:  [phone]

## 2011-08-18 NOTE — Care Management Note (Signed)
    Page 1 of 1   08/18/2011     11:22:34 AM   CARE MANAGEMENT NOTE 08/18/2011  Patient:  Vincent Dennis, Vincent Dennis   Account Number:  [id number]  Date Initiated:  08/18/2011  Documentation initiated by:  GRAVES-BIGELOW,Leighton Brickley  Subjective/Objective Assessment:   pt admitted with slurred speech, right sided weakness.Pt has family support. PT did recommend that pt will need outpatient PT services. DME recommended: RW/ 3-n-1.     Action/Plan:   CM did go by to visit with pt. Wife was in the room. Pt in procedure. Will continue up f/u with pt after procedure.   Anticipated DC Date:  08/18/2011   Anticipated DC Plan:  HOME/SELF CARE      DC Planning Services  CM consult      Choice offered to / List presented to:             Status of service:  Completed, signed off Medicare Important Message given?   (If response is "NO", the following Medicare IM given date fields will be blank) Date Medicare IM given:   Date Additional Medicare IM given:    Discharge Disposition:  HOME/SELF CARE  Per UR Regulation:    If discussed at Long Length of Stay Meetings, dates discussed:    Comments:  08-18-11 1120 Tomi Bamberger, RN,BSN [phone] Please make CM aware if needs to set up outpatient services for PT.  Please place order for DME listed above. Thanks

## 2011-08-18 NOTE — H&P (Signed)
CC: Right sided weakness worrisome for TIA/CVA. Patient presents today for MRI of the brain with moderate sedation due to claustrophobia. See exam from MD earlier today.   Subjective: patient seen and examined this morning. denies any  weakness and speech improved. Could not get MRI yday due to anxiety and sent back  Objective:  Vital signs in last 24 hours:    Filed Vitals:     08/18/11 0100  08/18/11 0400  08/18/11 0800  08/18/11 0813   BP:    134/85  109/73     Pulse:    69  70     Temp:    97.8 F (36.6 C)  97.6 F (36.4 C)     TempSrc:    Oral  Oral     Resp:    20  18     Height:  6\' 2"  (1.88 m)         Weight:  124.875 kg (275 lb 4.8 oz)         SpO2:    97%  93%  95%     Intake/Output from previous day:  No intake or output data in the 24 hours ending 08/18/11 1053  Physical Exam:  General:middle aged in no acute distress.   HEENT: no pallor, no icterus, moist oral mucosa, no JVD, no lymphadenopathy   Heart: Normal s1 &s2 Regular rate and rhythm, without murmurs, rubs, gallops.   Lungs: Clear to auscultation bilaterally.   Abdomen: Soft, nontender, nondistended, positive bowel sounds.   Extremities: No clubbing cyanosis or edema with positive pedal pulses.   Neuro: Alert, awake, oriented x3, nonfocal.  Lab Results:  Basic Metabolic Panel:    Component  Value  Date/Time     NA  143  08/16/2011 1421     K  3.8  08/16/2011 1421     CL  109  08/16/2011 1421     CO2  24  08/16/2011 1419     BUN  10  08/16/2011 1421     CREATININE  1.00  08/16/2011 2035     GLUCOSE  99  08/16/2011 1421     CALCIUM  9.4  08/16/2011 1419    CBC:    Component  Value  Date/Time     WBC  11.7*  08/16/2011 2035     HGB  15.7  08/16/2011 2035     HCT  45.1  08/16/2011 2035     PLT  213  08/16/2011 2035     MCV  87.6  08/16/2011 2035     NEUTROABS  10.2*  08/16/2011 1419     LYMPHSABS  2.1  08/16/2011 1419     MONOABS  1.1*  08/16/2011 1419     EOSABS  0.1  08/16/2011 1419     BASOSABS   0.1  08/16/2011 1419     Studies/Results: Dg Chest 2 View  08/17/2011  *RADIOLOGY REPORT*  Clinical Data: Coughing and chest congestion.  CHEST - 2 VIEW  Comparison: 10/14/2009  Findings: The lungs are clear without focal infiltrate, edema, pneumothorax or pleural effusion. Cardiopericardial silhouette is at upper limits of normal for size. Interstitial markings are diffusely coarsened with chronic features. Imaged bony structures of the thorax are intact.  IMPRESSION: No acute cardiopulmonary findings.  Original Report Authenticated By: ERIC A. MANSELL, M.D.   Ct Head Wo Contrast  08/16/2011  *RADIOLOGY REPORT*  Clinical Data: Slurred speech with right-sided weakness.  Code stroke.  CT HEAD WITHOUT CONTRAST  Technique:  Contiguous axial images were obtained from the base of the skull through the vertex without contrast.  Comparison: Head CT 08/23/2009.  MRI the brain 08/22/2010.  Findings: The patient's head is tilted in the CT gantry.  There is no evidence of acute intracranial hemorrhage, mass lesion, brain edema or extra-axial fluid collection.  The ventricles and subarachnoid spaces are appropriately sized for age.  There is no CT evidence of acute cortical infarction.  The middle cerebral arteries appear symmetric allowing for patient rotation.  The visualized paranasal sinuses are clear. The calvarium is intact.  IMPRESSION: Stable examination.  No evidence of acute hemorrhage or stroke.  These results were called by telephone on 08/16/2011  at  1428 hours to  Dr. Deretha Emory, who verbally acknowledged these results.  Original Report Authenticated By: Gerrianne Scale, M.D.     Medications: Scheduled Meds:     .  aspirin   325 mg  Oral  Daily   .  budesonide-formoterol   2 puff  Inhalation  BID   .  enoxaparin   40 mg  Subcutaneous  Q24H   .  guaiFENesin   10 mL  Oral  Q4H   .  LORazepam   4 mg  Intravenous  Once   .  nicotine   14 mg  Transdermal  Q24H   .  simvastatin   20 mg  Oral  q1800     Continuous Infusions:  PRN Meds:.acetaminophen, albuterol, ALPRAZolam, fentaNYL, HYDROcodone-acetaminophen, midazolam, zolpidem, DISCONTD: guaiFENesin   Assessment/Plan:   46 y/o male who is an active smoker , hx of COPD and CAD presented with slurry speech and rt sided weakness concerning for TIA/ CVA.   *TIA (transient ischemic attack)/ CVA   Continue tele monitoring   head CT unremarkable   MRI/ MR brain, 2 decho pending. Carotid doppler unremarkable. Could not get MRI yday due to anxiety and recommend for conscious sedation.   check Lipid panel and A1C   counseled on smoking cessation   Speech consult   Appreciate Neurology consult   Cont ASA   Dyslipidemia noted.  added zocor   COPD (chronic obstructive pulmonary disease)   Persistent cough on presentation    CXR unremarkable robitussin prn , symptoms better now  CAD with hx of MI  Not on any meds   cont ASA, added statin   Tobacco abuse   smokes 1 PPD for several years   counseled strongly on smoking cessation   Nicotine patch   DVT prophylaxis   Cardiac diet   LOS: 2 days   DHUNGEL, NISHANT 08/18/2011, 10:53 AM      Physical examination reveals CV: RRR, Lungs : CTA bilaterally.  Speech is clear. He is alert and oriented.  Airway = 1.  Patient talked with in regards to potential complications with moderate sedation including but not limited to cardiac complications, respiratory complications and further neurological deficits with his apparent understanding.  Patient denies any complications with prior sedation or anesthesia.  Written consent obtained for moderate sedation to obtain MRI.

## 2011-08-18 NOTE — Progress Notes (Signed)
UR Completed Tymel Conely Graves-Bigelow, RN,BSN [phone]  

## 2011-08-18 NOTE — ED Notes (Signed)
O2 2L [place] 

## 2011-08-19 DIAGNOSIS — M629 Disorder of muscle, unspecified: Secondary | ICD-10-CM

## 2011-08-19 DIAGNOSIS — J438 Other emphysema: Secondary | ICD-10-CM

## 2011-08-19 DIAGNOSIS — F411 Generalized anxiety disorder: Secondary | ICD-10-CM

## 2011-08-19 DIAGNOSIS — F17200 Nicotine dependence, unspecified, uncomplicated: Secondary | ICD-10-CM

## 2011-08-19 DIAGNOSIS — R531 Weakness: Secondary | ICD-10-CM | POA: Diagnosis present

## 2011-08-19 NOTE — Progress Notes (Signed)
Pt in stable condition, discharged via wheelchair to private vehicle with wife. Assessment unchanged from this am

## 2011-08-19 NOTE — Discharge Instructions (Signed)
STROKE/TIA DISCHARGE INSTRUCTIONS SMOKING Cigarette smoking nearly doubles your risk of having a stroke & is the single most alterable risk factor  If you smoke or have smoked in the last 12 months, you are advised to quit smoking for your health.  Most of the excess cardiovascular risk related to smoking disappears within a year of stopping.  Ask you doctor about anti-smoking medications  [place] Quit Line: 1-800-QUIT NOW  Free Smoking Cessation Classes [phone]  CHOLESTEROL Know your levels; limit fat & cholesterol in your diet  Lipid Panel     Component Value Date/Time   CHOL 195 08/17/2011 0540   TRIG 288* 08/17/2011 0540   HDL 28* 08/17/2011 0540   CHOLHDL 7.0 08/17/2011 0540   VLDL 58* 08/17/2011 0540   LDLCALC 109* 08/17/2011 0540      Many patients benefit from treatment even if their cholesterol is at goal.  Goal: Total Cholesterol (CHOL) less than 160  Goal:  Triglycerides (TRIG) less than 150  Goal:  HDL greater than 40  Goal:  LDL (LDLCALC) less than 100   BLOOD PRESSURE American Stroke Association blood pressure target is less that 120/80 mm/Hg  Your discharge blood pressure is:  BP: 137/87 mmHg  Monitor your blood pressure  Limit your salt and alcohol intake  Many individuals will require more than one medication for high blood pressure  DIABETES (A1c is a blood sugar average for last 3 months) Goal HGBA1c is under 7% (HBGA1c is blood sugar average for last 3 months)  Diabetes: {STROKE DC DIABETES:22357}    Lab Results  Component Value Date   HGBA1C 5.7* 08/16/2011     Your HGBA1c can be lowered with medications, healthy diet, and exercise.  Check your blood sugar as directed by your physician  Call your physician if you experience unexplained or low blood sugars.  PHYSICAL ACTIVITY/REHABILITATION Goal is 30 minutes at least 4 days per week    {STROKE DC ACTIVITY/REHAB:22359}  Activity decreases your risk of heart attack and stroke and makes your heart  stronger.  It helps control your weight and blood pressure; helps you relax and can improve your mood.  Participate in a regular exercise program.  Talk with your doctor about the best form of exercise for you (dancing, walking, swimming, cycling).  DIET/WEIGHT Goal is to maintain a healthy weight  Your discharge diet is:  Heart healthy Your height is:  Height: 6\' 2"  (188 cm)6.2 Your current weight is: 275 Your Body Mass Index (BMI) is:  BMI (Calculated): 35.4 35.3  Following the type of diet specifically designed for you will help prevent another stroke.  Your goal weight range is:   Your goal Body Mass Index (BMI) is 19-24.  Healthy food habits can help reduce 3 risk factors for stroke:  High cholesterol, hypertension, and excess weight.  RESOURCES Stroke/Support Group:  Call [phone]  they meet the 3rd Sunday of the month on the Rehab Unit at [facility], [place] ( no meetings June, July & Aug).  STROKE EDUCATION PROVIDED/REVIEWED AND GIVEN TO PATIENT Stroke warning signs and symptoms How to activate emergency medical system (call 911). Medications prescribed at discharge. Need for follow-up after discharge. Personal risk factors for stroke. Pneumonia vaccine given:   {STROKE DC YES/NO/DATE:22363} Flu vaccine given:   {STROKE DC YES/NO/DATE:22363} My questions have been answered, the writing is legible, and I understand these instructions.  I will adhere to these goals & educational materials that have been provided to me after my discharge  from the hospital.    Cardiac Event Monitoring A cardiac event monitor is a small recording device used to help detect abnormal heart rhythms. When the heart does not beat in a stable or regular pattern, this is called an arrhythmia. An arrhythmia may or may not be felt. Palpitations, or fast heart beats, may be felt. A cardiac event monitor is used to record the heart rhythm when noticeable symptoms such as the following occur:  Palpitations,  such as heart racing or fluttering.   Dizziness.   Fainting or lightheadedness.   Unexplained weakness.  The monitor is wired to two electrodes placed on your chest. Electrodes are flat sticky disks that attach to your skin. The monitor stores the heart rhythm and can be worn for up to 30 days. You will wear the monitor at all times, except when bathing. When you feel symptoms of heart trouble, such as dizziness, weakness, lightheadedness, palpitations, "thumping," shortness of breath or a fluttering or racing heart, you will push a button on the monitor to record the heart rhythm.  Your caregiver will also ask you to keep a diary of your activities, such as walking, doing chores and taking medicine. Be sure to note what you are doing when you experience heart symptoms. This will help your caregiver determine what might be contributing to your symptoms. The information stored in your monitor will be reviewed by your caregiver alongside your diary entries.  LET YOUR CAREGIVER KNOW ABOUT: Allergies to any kind of medical tape. PROCEDURE  A technician will prepare your chest for the electrode placement. The technician will show you how to place the electrodes, how to work the monitor, how to replace the batteries and how to fill in your diary. Take time to practice using the monitor before you leave the office. Make sure you understand how to send the information from the monitor to your doctor. This requires a telephone with a land line, not a cellphone.   If you start to feel lightheaded, dizzy, weak or have fluttering or racing sensations in your heart, press the record button. The monitor is always on and records what happened slightly before you pressed the button, so do not worry about being too late to get good information.  HOME CARE INSTRUCTIONS   Wear your monitor at all times, except when you are in water:   Do not get the monitor wet.   Take the monitor off when bathing, do not swim or  use a hot tub with it on.   Keep your skin clean, do not put body lotion or moisturizer on your chest.   It's possible that your skin under the electrodes could become irritated. To keep this from happening, try to put the electrodes in slightly different places on your chest. However, they must remain in the area under your left breast and in the upper right section of your chest.   Change the electrodes daily or any time they stop sticking to your skin. You might need to use tape to keep them on.   Make sure the monitor is safely clipped to your clothing or in a location close to your body that your caregiver recommends.   Keep a diary of your activities, especially what you were doing when you pushed the button to record your heart symptoms.   Change the batteries as recommended by your caregiver.   Send the recorded information as recommended by your caregiver. It is important to understand that your caregiver does  not have instantaneous results when a recording is made.  SEEK MEDICAL CARE IF:   Your doctor reviews your records and advises you to come to the office or go to the ER.   You have questions or concerns about the cardiac event monitor.  SEEK IMMEDIATE MEDICAL CARE IF:   You have chest pain.   You have difficulty breathing or shortness of breath.   You develop a very fast heartbeat or your heart "skips" beats.   You develop dizziness which lasts several minutes.   You faint or feel you are about to faint.  MAKE SURE YOU:   Understand these instructions.   Will watch your condition.   Will get help right away if you are not doing well or get worse.  Document Released: 12/22/2007 Dcument Revised: 03/03/2011 Document Reviewed: 12/22/2007 [facility] Patient Information 2012 [place], [place].

## 2011-08-19 NOTE — Discharge Summary (Signed)
Patient ID: Vincent Dennis MRN: [id number] DOB/AGE: October 15, 1965 46 y.o.  Admit date: 08/16/2011 Discharge date: 08/19/2011  Primary Care Physician:  Jearld Lesch, MD, MD  Discharge Diagnoses:     Principal Problem:  *Weakness of right side of body,  non organic  Active Problems:  COPD (chronic obstructive pulmonary disease)  Back pain, chronic  Headache  History of MI (myocardial infarction)  Anxiety Elevated triglycerides Active tobacco use   Medication List  As of 08/19/2011 10:41 AM   STOP taking these medications         SUMAtriptan 6 MG/0.5ML Soln injection         TAKE these medications         acetaminophen 500 MG tablet   Commonly known as: TYLENOL   Take 1,000 mg by mouth every 6 (six) hours as needed. For cough      ALPRAZolam 1 MG tablet   Commonly known as: XANAX   Take 1 mg by mouth 3 (three) times daily as needed. For anxiety      budesonide-formoterol 160-4.5 MCG/ACT inhaler   Commonly known as: SYMBICORT   Inhale 2 puffs into the lungs 2 (two) times daily.      guaifenesin 100 MG/5ML syrup   Commonly known as: ROBITUSSIN   Take 30 mLs by mouth at bedtime as needed. For cough      HYDROcodone-acetaminophen 10-325 MG per tablet   Commonly known as: NORCO   Take 1 tablet by mouth every 6 (six) hours as needed. For pain      SUMAtriptan 100 MG tablet   Commonly known as: IMITREX   Take 100 mg by mouth every 2 (two) hours as needed. For headache      zolpidem 10 MG tablet   Commonly known as: AMBIEN   Take 10 mg by mouth at bedtime as needed. For sleeping            Disposition and Follow-up: home  Consults:  Sethi ( neurology)  Significant Diagnostic Studies:  Ct Head Wo Contrast  08/16/2011  *RADIOLOGY REPORT*  Clinical Data: Slurred speech with right-sided weakness.  Code stroke.  CT HEAD WITHOUT CONTRAST  Technique:  Contiguous axial images were obtained from the base of the skull through the vertex without contrast.   Comparison: Head CT 08/23/2009.  MRI the brain 08/22/2010.  Findings: The patient's head is tilted in the CT gantry.  There is no evidence of acute intracranial hemorrhage, mass lesion, brain edema or extra-axial fluid collection.  The ventricles and subarachnoid spaces are appropriately sized for age.  There is no CT evidence of acute cortical infarction.  The middle cerebral arteries appear symmetric allowing for patient rotation.  The visualized paranasal sinuses are clear. The calvarium is intact.  IMPRESSION: Stable examination.  No evidence of acute hemorrhage or stroke.  These results were called by telephone on 08/16/2011  at  1428 hours to  Dr. Deretha Emory, who verbally acknowledged these results.  Original Report Authenticated By: Gerrianne Scale, M.D.    Brief H and P: For complete details please refer to admission H and P, but in brief 46 years old man who was brought in to the ED today as code stroke with chief complain of right-sided numbness and weakness started around noon today. He also stated that he felt funny and had trouble speaking which was noted by his family. His symptoms improved by the time he came into the ER. However he started to complain of frontal headache.  He denies any blurring of vision, neck pain or stiffness or any other complaints. Patient stated that he had 2 previous similar episodes in the past  In the ED patient was evaluated by neurology, code stroke was canceled after symptoms resolved and CT head showed no acute abnormalities. He was consulted to admit for further management.   Physical Exam on Discharge:  Filed Vitals:   08/18/11 2110 08/19/11 0010 08/19/11 0406 08/19/11 0809  BP:  110/66 137/87   Pulse:  63 65   Temp:  98.7 F (37.1 C) 97.9 F (36.6 C)   TempSrc:      Resp:  20 20   Height:      Weight:      SpO2: 95% 94% 95% 93%     Intake/Output Summary (Last 24 hours) at 08/19/11 1041 Last data filed at 08/18/11 2101  Gross per 24 hour    Intake    222 ml  Output      0 ml  Net    222 ml    General: Alert, awake, oriented x3, in no acute distress. HEENT: No bruits, no goiter. Heart: Regular rate and rhythm, without murmurs, rubs, gallops. Lungs: Clear to auscultation bilaterally. Abdomen: Soft, nontender, nondistended, positive bowel sounds. Extremities: No clubbing cyanosis or edema with positive pedal pulses. Neuro: Grossly intact, nonfocal.  CBC:    Component Value Date/Time   WBC 11.7* 08/16/2011 2035   HGB 15.7 08/16/2011 2035   HCT 45.1 08/16/2011 2035   PLT 213 08/16/2011 2035   MCV 87.6 08/16/2011 2035   NEUTROABS 10.2* 08/16/2011 1419   LYMPHSABS 2.1 08/16/2011 1419   MONOABS 1.1* 08/16/2011 1419   EOSABS 0.1 08/16/2011 1419   BASOSABS 0.1 08/16/2011 1419    Basic Metabolic Panel:    Component Value Date/Time   NA 143 08/16/2011 1421   K 3.8 08/16/2011 1421   CL 109 08/16/2011 1421   CO2 24 08/16/2011 1419   BUN 10 08/16/2011 1421   CREATININE 1.00 08/16/2011 2035   GLUCOSE 99 08/16/2011 1421   CALCIUM 9.4 08/16/2011 1419    Hospital Course:    *Weakness of right side of body,( non organic) He was admitted to tele with concern for TIA. Head CT, 2D echo, carotid doppler and MRI brain all unremarkable. Patient presented with som word fining difficulty and rt sided numbness which subsequently resolved.  It appears that Patient has non organic weakness of right side of the body and mostly is related to stress and anxiety which patient admits to.  He can be discharged home with outpatient PCP follow up  his Triglycerides are mildly elevated  and i have counseled him on avoiding fatty foods in diet, weight reduction and moderate daily exercises   also strongly counseled on smoking cessation. patient will discus with his PCP on using nicotine patches.  -seen by neurology and appreciate recommendations   COPD (chronic obstructive pulmonary disease) Stable counseled on smoking cessation Cont symbicort  Continue  remaining home meds   patient stable for discharge homer with outpt follow up.    Time spent on Discharge: 45 minutes  Signed: Eddie North 08/19/2011, 10:41 AM

## 2011-08-19 NOTE — Progress Notes (Signed)
Stroke Team Progress Note  HISTORY GARCIA DALZELL is an 46 y.o. male admitted 08/16/11 with dysarthria and right sided numbness/weakness. Patient was at home playing his guitar with his son and started to feel funny at around 1300 08/16/11, He states he was able to understand what was being said, but could not get his words out right. Also had difficulty using right arm and walking due to numbness and weakness on right side. His wife brought him to ER 45 min after onset of sx. Code stroke was called, but cancelled by stroke team; suspected conversion disorder. Patient states he was still having trouble speaking 08/17/2011 am, but is almost back to normal. Patient was not a TPA candidate secondary to non organic symptoms..   SUBJECTIVE Patient concerned that one eye brow is higher than the other this morning upon awakening.  OBJECTIVE Most recent Vital Signs: Filed Vitals:   08/18/11 2110 08/19/11 0010 08/19/11 0406 08/19/11 0809  BP:  110/66 137/87   Pulse:  63 65   Temp:  98.7 F (37.1 C) 97.9 F (36.6 C)   TempSrc:      Resp:  20 20   Height:      Weight:      SpO2: 95% 94% 95% 93%   CBG (last 3)  Basename 08/17/11 1131 08/17/11 0733 08/16/11 2337  GLUCAP 124* 88 120*   Intake/Output from previous day: 05/23 0701 - 05/24 0700 In: 222 [P.O.:222] Out: -  IV Fluid Intake:     . sodium chloride 50 mL/hr (08/18/11 1600)   MEDICATIONS    . aspirin  325 mg Oral Daily  . budesonide-formoterol  2 puff Inhalation BID  . enoxaparin  40 mg Subcutaneous Q24H  . fentaNYL      . guaiFENesin  10 mL Oral Q4H  . LORazepam  4 mg Intravenous Once  . midazolam      . nicotine  14 mg Transdermal Q24H  . simvastatin  20 mg Oral q1800   PRN:  sodium chloride, acetaminophen, albuterol, ALPRAZolam, fentaNYL, fentaNYL, HYDROcodone-acetaminophen, midazolam, midazolam, zolpidem, DISCONTD: fentaNYL, DISCONTD: midazolam, DISCONTD: midazolam  Diet:  Cardiac thin liquids Activity:   Bathroom privileges  with assistance DVT Prophylaxis:  Lovenox 40 mg sq daily   CLINICALLY SIGNIFICANT STUDIES Basic Metabolic Panel:  Lab 08/16/11 1610 08/16/11 1421 08/16/11 1419  NA -- 143 139  K -- 3.8 3.7  CL -- 109 102  CO2 -- -- 24  GLUCOSE -- 99 96  BUN -- 10 10  CREATININE 1.00 1.20 --  CALCIUM -- -- 9.4  MG -- -- --  PHOS -- -- --   Liver Function Tests:  Lab 08/16/11 1419  AST 27  ALT 26  ALKPHOS 82  BILITOT 0.5  PROT 7.4  ALBUMIN 4.2   CBC:  Lab 08/16/11 2035 08/16/11 1421 08/16/11 1419  WBC 11.7* -- 13.7*  NEUTROABS -- -- 10.2*  HGB 15.7 16.3 --  HCT 45.1 48.0 --  MCV 87.6 -- 87.6  PLT 213 -- 217   Coagulation:  Lab 08/16/11 1419  LABPROT 13.3  INR 0.99   Cardiac Enzymes:  Lab 08/17/11 0540 08/16/11 2034 08/16/11 1419  CKTOTAL 336* 366* 424*  CKMB 3.3 3.5 4.6*  CKMBINDEX -- -- --  TROPONINI <0.30 <0.30 <0.30   Lipid Panel    Component Value Date/Time   CHOL 195 08/17/2011 0540   TRIG 288* 08/17/2011 0540   HDL 28* 08/17/2011 0540   CHOLHDL 7.0 08/17/2011 0540   VLDL  58* 08/17/2011 0540   LDLCALC 109* 08/17/2011 0540   HgbA1C  Lab Results  Component Value Date   HGBA1C 5.7* 08/16/2011    Urine Drug Screen:     Component Value Date/Time   LABOPIA NONE DETECTED 08/16/2011 2300   COCAINSCRNUR NONE DETECTED 08/16/2011 2300   LABBENZ POSITIVE* 08/16/2011 2300   AMPHETMU NONE DETECTED 08/16/2011 2300   THCU POSITIVE* 08/16/2011 2300   LABBARB NONE DETECTED 08/16/2011 2300    Alcohol Level: No results found for this basename: ETH:2 in the last 168 hours  CT of the brain  08/16/2011 Stable examination.  No evidence of acute hemorrhage or stroke.   MRI of the brain  08/19/2011   Motion degraded exam.  No acute infarct.  Minimal partial opacification inferior right mastoid air cells. Moderate left maxillary sinus mucosal thickening with air fluid levels suggesting acute sinusitis.  Minimal mucosal thickening ethmoid sinus air cells and sphenoid sinus air cells.  Prominence  of the soft tissue in the posterior-superior nasopharynx   MRA of the brain   Motion degraded exam.  This limits evaluation for grading stenosis accurately or detecting aneurysm.  Intracranial vascular narrowing irregularity   2D Echocardiogram  EF 50-55% with no source of embolus.   Carotid Doppler  No internal carotid artery stenosis bilaterally. Vertebrals with antegrade flow bilaterally.   CXR  08/17/2011 No acute cardiopulmonary findings.    EKG  normal sinus rhythm.   Therapy Recommendations PT -outpatient PT, OT -  Physical Exam  Obese young caucasian male not in distress.Awake alert. Afebrile. Head is nontraumatic. Neck is supple without bruit. Hearing is normal. Cardiac exam no murmur or gallop. Lungs are clear to auscultation. Distal pulses are well felt.  Neurological exam ; Awake  Alert oriented x 3. Normal speech and language.eye movements full without nystagmus. Face symmetric. Tongue midline. Normal strength, tone, reflexes and coordination. Normal sensation. Gait deferred. He has poor and variable effort at times but can be distracted.  ASSESSMENT Mr. BERTON BUTRICK is a 46 y.o. male with a nonorganic dysarthira and right sided-weakness, now resolved. No stroke or no TIA. On no antiplatelet prior to admission. Now on aspirin 325 mg orally every day for secondary stroke prevention. Similar episode in 2011 with negative neurovascular w/u.  -hyperlipidemia, on statin  Hospital day # 3  TREATMENT/PLAN -Continue aspirin 325 mg orally every day for secondary stroke prevention. -recommend discharge from neuro standpoint -Dr. Pearlean Brownie discussed with Dr. Gonzella Lex -no neuro follow up needed  [facility] BIBY, AVNP, ANP-BC, GNP-BC [facility] Pager: [phone] 08/19/2011 8:26 AM  Scribe for Dr. Delia Heady, Stroke Center Medical Director. He has personally reviewed chart, pertinent data, examined the patient and developed the plan of care. Pager:  [phone]

## 2011-08-19 NOTE — Progress Notes (Signed)
PT Cancellation Note    Treatment cancelled today due to pt ready to D/C in a short while.

## 2011-11-20 ENCOUNTER — Emergency Department (HOSPITAL_COMMUNITY): Payer: Medicaid Other

## 2011-11-20 ENCOUNTER — Emergency Department (HOSPITAL_COMMUNITY)
Admission: EM | Admit: 2011-11-20 | Discharge: 2011-11-20 | Disposition: A | Discharge status: Home / Self Care | Payer: Medicaid Other | Attending: Emergency Medicine | Admitting: Emergency Medicine

## 2011-11-20 ENCOUNTER — Encounter (HOSPITAL_COMMUNITY): Payer: Self-pay | Admitting: Emergency Medicine

## 2011-11-20 DIAGNOSIS — I10 Essential (primary) hypertension: Secondary | ICD-10-CM | POA: Insufficient documentation

## 2011-11-20 DIAGNOSIS — R0602 Shortness of breath: Secondary | ICD-10-CM | POA: Insufficient documentation

## 2011-11-20 DIAGNOSIS — R079 Chest pain, unspecified: Secondary | ICD-10-CM | POA: Insufficient documentation

## 2011-11-20 DIAGNOSIS — F411 Generalized anxiety disorder: Secondary | ICD-10-CM | POA: Insufficient documentation

## 2011-11-20 DIAGNOSIS — F17200 Nicotine dependence, unspecified, uncomplicated: Secondary | ICD-10-CM | POA: Insufficient documentation

## 2011-11-20 DIAGNOSIS — I252 Old myocardial infarction: Secondary | ICD-10-CM | POA: Insufficient documentation

## 2011-11-20 LAB — BASIC METABOLIC PANEL
Calcium: 9.4 mg/dL (ref 8.4–10.5)
GFR calc Af Amer: 86 mL/min — ABNORMAL LOW (ref >90)
GFR calc non Af Amer: 74 mL/min — ABNORMAL LOW (ref >90)
Glucose, Bld: 136 mg/dL — ABNORMAL HIGH (ref 70–99)
Potassium: 3.9 mEq/L (ref 3.5–5.1)
Sodium: 140 mEq/L (ref 135–145)

## 2011-11-20 LAB — POCT I-STAT TROPONIN I: Troponin i, poc: 0 ng/mL (ref 0.00–0.08)

## 2011-11-20 LAB — CBC
MCH: 30.6 pg (ref 26.0–34.0)
MCHC: 34.9 g/dL (ref 30.0–36.0)
Platelets: 206 10*3/uL (ref 150–400)
RDW: 12.8 % (ref 11.5–15.5)

## 2011-11-20 MED ORDER — DIPHENHYDRAMINE HCL 25 MG PO CAPS
25.0000 mg | ORAL_CAPSULE | Freq: Four times a day (QID) | ORAL | Status: DC | PRN
  Administered 2011-11-20: 25 mg via ORAL
  Filled 2011-11-20: qty 1

## 2011-11-20 MED ORDER — NITROGLYCERIN 2 % TD OINT
1.0000 [in_us] | TOPICAL_OINTMENT | Freq: Once | TRANSDERMAL | Status: AC
  Administered 2011-11-20: 1 [in_us] via TOPICAL
  Filled 2011-11-20: qty 1

## 2011-11-20 MED ORDER — ACETAMINOPHEN 325 MG PO TABS
650.0000 mg | ORAL_TABLET | Freq: Once | ORAL | Status: AC
  Administered 2011-11-20: 650 mg via ORAL
  Filled 2011-11-20: qty 2

## 2011-11-20 MED ORDER — MORPHINE SULFATE 4 MG/ML IJ SOLN
4.0000 mg | Freq: Once | INTRAMUSCULAR | Status: AC
  Administered 2011-11-20: 4 mg via INTRAVENOUS
  Filled 2011-11-20: qty 1

## 2011-11-20 MED ORDER — ASPIRIN 81 MG PO CHEW
324.0000 mg | CHEWABLE_TABLET | Freq: Once | ORAL | Status: AC
  Administered 2011-11-20: 324 mg via ORAL
  Filled 2011-11-20: qty 4

## 2011-11-20 MED ORDER — SODIUM CHLORIDE 0.9 % IV BOLUS (SEPSIS)
1000.0000 mL | Freq: Once | INTRAVENOUS | Status: AC
  Administered 2011-11-20: 1000 mL via INTRAVENOUS

## 2011-11-20 MED ORDER — OXYCODONE-ACETAMINOPHEN 5-325 MG PO TABS: 1.0000 | ORAL_TABLET | ORAL | Status: AC | PRN

## 2011-11-20 MED ORDER — ONDANSETRON HCL 4 MG/2ML IJ SOLN
4.0000 mg | Freq: Once | INTRAMUSCULAR | Status: AC
  Administered 2011-11-20: 4 mg via INTRAVENOUS
  Filled 2011-11-20: qty 2

## 2011-11-20 MED ORDER — OXYCODONE-ACETAMINOPHEN 5-325 MG PO TABS
2.0000 | ORAL_TABLET | Freq: Once | ORAL | Status: AC
  Administered 2011-11-20: 2 via ORAL
  Filled 2011-11-20: qty 2

## 2011-11-20 NOTE — ED Provider Notes (Signed)
History     CSN: [id number]  Arrival date & time 11/20/11  1550   First MD Initiated Contact with Patient 11/20/11 1606      Chief Complaint  Patient presents with  . Chest Pain    (Consider location/radiation/quality/duration/timing/severity/associated sxs/prior treatment) HPI Hx from pt. Vincent Dennis is a 46 y.o. male with past medical history of coronary artery disease who presents with chest pressure. He states that he was not feeling well when he awoke this morning but did not have any pain. About 2 hours prior to presentation, he was out in his yard chasing after his dog who had gotten loose when he suddenly had a sensation of chest pressure located to the central chest. He became nauseated, diaphoretic, and short of breath. He did vomit. He feels as if his bilateral arms are heavy. He denies any radiation of the pain. He does have chronic neck and back pain for which he is followed by pain management but reports no change in these pains. He denies any associated palpitations. No medications taken prior to coming here. Pain has not significantly changed since onset, and he reports continued dyspnea.   Patient does report that he has a history of CAD previously and was seen by [facility]. He does not have any stents. He has not seen a cardiologist in several years.  Past Medical History  Diagnosis Date  . Chronic back pain   . Myocardial infarction   . Angina   . Hypertension   . COPD (chronic obstructive pulmonary disease)   . Shortness of breath   . Recurrent upper respiratory infection (URI)   . GERD (gastroesophageal reflux disease)   . H/O hiatal hernia   . Headache   . Arthritis   . Mental disorder   . Anxiety   . Depression     Past Surgical History  Procedure Date  . Cardiac catheterization   . Hernia repair   . Back surgery   . Coronary angioplasty     No family history on file.  History  Substance Use Topics  . Smoking status: Current Everyday Smoker --  1.0 packs/day for 31 years  . Smokeless tobacco: Not on file  . Alcohol Use: No      Review of Systems  All other systems reviewed and are negative.    Allergies  Review of patient's allergies indicates no known allergies.  Home Medications   Current Outpatient Rx  Name Route Sig Dispense Refill  . ACETAMINOPHEN 500 MG PO TABS Oral Take 1,000 mg by mouth every 6 (six) hours as needed. For cough    . ALPRAZOLAM 1 MG PO TABS Oral Take 1 mg by mouth 3 (three) times daily as needed. For anxiety    . BUDESONIDE-FORMOTEROL FUMARATE 160-4.5 MCG/ACT IN AERO Inhalation Inhale 2 puffs into the lungs 2 (two) times daily.    . GUAIFENESIN 100 MG/5ML PO SYRP Oral Take 30 mLs by mouth at bedtime as needed. For cough    . HYDROCODONE-ACETAMINOPHEN 10-325 MG PO TABS Oral Take 1 tablet by mouth every 6 (six) hours as needed. For pain    . SUMATRIPTAN SUCCINATE 100 MG PO TABS Oral Take 100 mg by mouth every 2 (two) hours as needed. For headache    . ZOLPIDEM TARTRATE 10 MG PO TABS Oral Take 10 mg by mouth at bedtime as needed. For sleeping      BP 151/86  Pulse 95  Temp 98.1 F (36.7 C) (Oral)  Resp  16  Ht 6\' 2"  (1.88 m)  Wt 275 lb (124.739 kg)  BMI 35.31 kg/m2  SpO2 93%  Physical Exam  Nursing note and vitals reviewed. Constitutional: He appears well-developed and well-nourished. He appears distressed.  HENT:  Head: Normocephalic and atraumatic.  Mouth/Throat: Oropharynx is clear and moist.  Neck: Normal range of motion.  Cardiovascular: Normal rate, regular rhythm and normal heart sounds.   Pulmonary/Chest: Effort normal and breath sounds normal. He exhibits no tenderness.       Pain somewhat reproducible over sternum on exam  Abdominal: Soft. Bowel sounds are normal. There is no tenderness. There is no rebound and no guarding.  Musculoskeletal: Normal range of motion.  Neurological: He is alert.  Skin: Skin is warm and dry. He is not diaphoretic.  Psychiatric:       Anxious  appearing    ED Course  Procedures (including critical care time)   Date: 11/20/2011  Rate: 90 Rhythm: normal sinus rhythm  QRS Axis: normal  Intervals: normal  ST/T Wave abnormalities: nonspecific ST changes laterally  Conduction Disutrbances:none  Narrative Interpretation:   Old EKG Reviewed: as compared with Aug 16, 2011 no new changes   Labs Reviewed  CBC - Abnormal; Notable for the following:    WBC 11.4 (*)     All other components within normal limits  BASIC METABOLIC PANEL - Abnormal; Notable for the following:    Glucose, Bld 136 (*)     GFR calc non Af Amer 74 (*)     GFR calc Af Amer 86 (*)     All other components within normal limits  POCT I-STAT TROPONIN I  POCT I-STAT TROPONIN I   Dg Chest 2 View  11/20/2011  *RADIOLOGY REPORT*  Clinical Data: Chest pain, tightness, shortness of breath  CHEST - 2 VIEW  Comparison: 08/17/2011  Findings: Chronic interstitial markings/bronchitic changes.  No focal consolidation. No pleural effusion or pneumothorax.  Cardiomediastinal silhouette is within normal limits.  Mild degenerative changes of the visualized thoracolumbar spine.  IMPRESSION: No evidence of acute cardiopulmonary disease.  Chronic interstitial markings/bronchitic changes.   Original Report Authenticated By: Charline Bills, M.D.      1. Chest pain       MDM  Pt with chest pain which started about 2 hours prior to arrival. On exam, chest pain somewhat reproducible on exam. Pt anxious appearing. CXR and ECG with no acute findings. Troponin negative x 2. Pt's pain relieved with percocet; he was ambulatory in the dept without worsening of his pain or shortness of breath. Advised that pt call [place] in am to make a follow up for further eval and tx. Reasons to return sooner discussed. Pt verbalized understanding, was agreeable with plan.      Grant Fontana, PA-C 11/20/11 2128

## 2011-11-20 NOTE — ED Notes (Signed)
Pt stating that the nitro paste applied hasn't helped his chest pain and has only given him a HA.

## 2011-11-20 NOTE — ED Notes (Signed)
C/o tightness in center of chest with radiation to bilateral arms x 2 hours.  Reports sob, nausea, vomiting, and diaphoresis.

## 2011-11-22 NOTE — ED Provider Notes (Signed)
Medical screening examination/treatment/procedure(s) were performed by non-physician practitioner and as supervising physician I was immediately available for consultation/collaboration.   Richardean Canal, MD 11/22/11 [phone]

## 2011-12-08 ENCOUNTER — Other Ambulatory Visit: Payer: Self-pay | Admitting: Specialist

## 2011-12-08 DIAGNOSIS — E0590 Thyrotoxicosis, unspecified without thyrotoxic crisis or storm: Secondary | ICD-10-CM

## 2011-12-26 ENCOUNTER — Other Ambulatory Visit: Payer: Medicaid Other

## 2012-01-16 ENCOUNTER — Emergency Department (HOSPITAL_COMMUNITY)
Admission: EM | Admit: 2012-01-16 | Discharge: 2012-01-16 | Disposition: A | Discharge status: Home / Self Care | Payer: Medicaid Other | Attending: Emergency Medicine | Admitting: Emergency Medicine

## 2012-01-16 ENCOUNTER — Encounter (HOSPITAL_COMMUNITY): Payer: Self-pay | Admitting: *Deleted

## 2012-01-16 DIAGNOSIS — K089 Disorder of teeth and supporting structures, unspecified: Secondary | ICD-10-CM | POA: Insufficient documentation

## 2012-01-16 DIAGNOSIS — F17200 Nicotine dependence, unspecified, uncomplicated: Secondary | ICD-10-CM | POA: Insufficient documentation

## 2012-01-16 DIAGNOSIS — J4489 Other specified chronic obstructive pulmonary disease: Secondary | ICD-10-CM | POA: Insufficient documentation

## 2012-01-16 DIAGNOSIS — Z8679 Personal history of other diseases of the circulatory system: Secondary | ICD-10-CM | POA: Insufficient documentation

## 2012-01-16 DIAGNOSIS — K0889 Other specified disorders of teeth and supporting structures: Secondary | ICD-10-CM

## 2012-01-16 DIAGNOSIS — Z8719 Personal history of other diseases of the digestive system: Secondary | ICD-10-CM | POA: Insufficient documentation

## 2012-01-16 DIAGNOSIS — J449 Chronic obstructive pulmonary disease, unspecified: Secondary | ICD-10-CM | POA: Insufficient documentation

## 2012-01-16 DIAGNOSIS — Z8659 Personal history of other mental and behavioral disorders: Secondary | ICD-10-CM | POA: Insufficient documentation

## 2012-01-16 DIAGNOSIS — I252 Old myocardial infarction: Secondary | ICD-10-CM | POA: Insufficient documentation

## 2012-01-16 DIAGNOSIS — I10 Essential (primary) hypertension: Secondary | ICD-10-CM | POA: Insufficient documentation

## 2012-01-16 MED ORDER — OXYCODONE-ACETAMINOPHEN 5-325 MG PO TABS
1.0000 | ORAL_TABLET | Freq: Once | ORAL | Status: AC
  Administered 2012-01-16: 1 via ORAL
  Filled 2012-01-16: qty 1

## 2012-01-16 MED ORDER — PENICILLIN V POTASSIUM 500 MG PO TABS: 500.0000 mg | ORAL_TABLET | Freq: Four times a day (QID) | ORAL | Status: AC

## 2012-01-16 MED ORDER — HYDROCODONE-ACETAMINOPHEN 5-325 MG PO TABS: 1.0000 | ORAL_TABLET | ORAL | Status: DC | PRN

## 2012-01-16 MED ORDER — OXYCODONE-ACETAMINOPHEN 5-325 MG PO TABS: 1.0000 | ORAL_TABLET | ORAL | Status: DC | PRN

## 2012-01-16 NOTE — ED Notes (Signed)
Pt is here with toothache in the area where he had a tooth pulled, he worries that he has "dry socket".  He also reports that he had a abscess which popped and has generalized pain in his gums.

## 2012-01-16 NOTE — ED Provider Notes (Signed)
History  This chart was scribed for Flint Melter, MD by Lynelle Smoke. The patient was seen in room TR10C/TR10C. Patient's care was started at 1:21PM.   CSN: [id number]  Arrival date & time 01/16/12  1221   First MD Initiated Contact with Patient 01/16/12 1321      Chief Complaint  Patient presents with  . Dental Pain     The history is provided by the patient. No language interpreter was used.  Vincent Dennis is a 46 y.o. male who presents to the Emergency Department complaining of 4 days of gradual onset, gradually worsening right sided dental pain that he attributes to a "dry socket" after having a tooth pulled 3 to 4 months ago. He states that he had an abscess drain serosanguinous fluid in the lower right area while he was brushing his teeth 3 days ago. He also complains dizziness and nausea that he attributes to the dental abscess. He stated that he has experienced nausea and fever. He denies fever, sneezing, emesis and chills as associated symptoms. He has a h/o MI, HTN, COPD, GERD, anxiety and depression and is a current everyday smoker but denies alcohol user.   PCP is Lerry Liner  Past Medical History  Diagnosis Date  . Chronic back pain   . Myocardial infarction   . Angina   . Hypertension   . COPD (chronic obstructive pulmonary disease)   . Shortness of breath   . Recurrent upper respiratory infection (URI)   . GERD (gastroesophageal reflux disease)   . H/O hiatal hernia   . Headache   . Arthritis   . Mental disorder   . Anxiety   . Depression     Past Surgical History  Procedure Date  . Cardiac catheterization   . Hernia repair   . Back surgery   . Coronary angioplasty     No family history on file.  History  Substance Use Topics  . Smoking status: Current Every Day Smoker -- 1.0 packs/day for 31 years  . Smokeless tobacco: Not on file  . Alcohol Use: No      Review of Systems  Constitutional: Positive for fever. Negative for chills.    HENT: Positive for dental problem. Negative for facial swelling.   Respiratory: Negative for cough.   Gastrointestinal: Positive for nausea. Negative for vomiting.  All other systems reviewed and are negative.    Allergies  Review of patient's allergies indicates no known allergies.  Home Medications   Current Outpatient Rx  Name Route Sig Dispense Refill  . ALPRAZOLAM 1 MG PO TABS Oral Take 1 mg by mouth 3 (three) times daily. For anxiety    . GOODY HEADACHE PO Oral Take 2 packets by mouth daily as needed. For pain    . BUDESONIDE-FORMOTEROL FUMARATE 160-4.5 MCG/ACT IN AERO Inhalation Inhale 2 puffs into the lungs 2 (two) times daily.    Marland Kitchen DIAZEPAM 5 MG PO TABS Oral Take 5 mg by mouth every 6 (six) hours as needed. For spasms    . HYDROCODONE-ACETAMINOPHEN 10-325 MG PO TABS Oral Take 1 tablet by mouth every 8 (eight) hours. For pain    . HYDROCODONE-ACETAMINOPHEN 5-325 MG PO TABS Oral Take 1 tablet by mouth every 4 (four) hours as needed for pain. 20 tablet 0  . OXYCODONE-ACETAMINOPHEN 5-325 MG PO TABS Oral Take 1 tablet by mouth every 4 (four) hours as needed for pain. 20 tablet 0  . PENICILLIN V POTASSIUM 500 MG PO TABS Oral Take  1 tablet (500 mg total) by mouth 4 (four) times daily. 40 tablet 0    Triage vitals: BP 124/94  Pulse 87  Temp 99.3 F (37.4 C) (Oral)  Resp 24  SpO2 99%  Physical Exam  Nursing note and vitals reviewed. Constitutional: He is oriented to person, place, and time. He appears well-developed and well-nourished.  HENT:  Head: Normocephalic and atraumatic.  Right Ear: External ear normal.  Left Ear: External ear normal.       No trismus, no mouth lesions, no gingiva erythema, plaque on teeth, no abscess, no swelling  Eyes: Conjunctivae normal and EOM are normal. Pupils are equal, round, and reactive to light.  Neck: Normal range of motion and phonation normal. Neck supple.  Cardiovascular: Normal rate, regular rhythm, normal heart sounds and intact  distal pulses.   Pulmonary/Chest: Effort normal and breath sounds normal. He exhibits no bony tenderness.  Abdominal: Soft. Normal appearance. There is no tenderness.  Musculoskeletal: Normal range of motion.  Neurological: He is alert and oriented to person, place, and time. He has normal strength. No cranial nerve deficit or sensory deficit. He exhibits normal muscle tone. Coordination normal.  Skin: Skin is warm, dry and intact.  Psychiatric: He has a normal mood and affect. His behavior is normal. Judgment and thought content normal.    ED Course  Procedures (including critical care time)  DIAGNOSTIC STUDIES: Oxygen Saturation is 99% on room air, normal by my interpretation.    COORDINATION OF CARE: 1:55 PM: Discussed treatment plan which includes pain medication and antibiotics with pt at bedside and pt agreed to plan.  2:00PM-Ordered one 5-325 mg percocet  2:13PM-Prescribed 5-325 mg Norco and 500 mg Penicillin   Patient returned after leaving the emergency department requesting a prescription for Percocet. He stated he could not fill the Norco prescription because he had an active prescription already for hydrocodone  Labs Reviewed - No data to display No results found.   1. Toothache       MDM  Nonspecific dental pain, without evidence for infection  Plan: Home Medications- Norco and Penicillin; Home Treatments- use medications as prescribed; Recommended follow up- with a dentist within the next week     I personally performed the services described in this documentation, which was scribed in my presence. The recorded information has been reviewed and considered.     Flint Melter, MD 01/16/12 1719

## 2012-01-16 NOTE — ED Notes (Signed)
Pt had toothed pulled 3 months ago. States did not follow up or take Abx as prescribed. Started with pain right upper jaw 3-4 days ago.

## 2012-07-30 ENCOUNTER — Emergency Department (HOSPITAL_COMMUNITY)
Admission: EM | Admit: 2012-07-30 | Discharge: 2012-07-31 | Disposition: A | Discharge status: Home / Self Care | Payer: Medicaid Other | Attending: Emergency Medicine | Admitting: Emergency Medicine

## 2012-07-30 DIAGNOSIS — M129 Arthropathy, unspecified: Secondary | ICD-10-CM | POA: Insufficient documentation

## 2012-07-30 DIAGNOSIS — F17200 Nicotine dependence, unspecified, uncomplicated: Secondary | ICD-10-CM | POA: Insufficient documentation

## 2012-07-30 DIAGNOSIS — J4489 Other specified chronic obstructive pulmonary disease: Secondary | ICD-10-CM | POA: Insufficient documentation

## 2012-07-30 DIAGNOSIS — Z8709 Personal history of other diseases of the respiratory system: Secondary | ICD-10-CM | POA: Insufficient documentation

## 2012-07-30 DIAGNOSIS — H9209 Otalgia, unspecified ear: Secondary | ICD-10-CM | POA: Insufficient documentation

## 2012-07-30 DIAGNOSIS — Z8679 Personal history of other diseases of the circulatory system: Secondary | ICD-10-CM | POA: Insufficient documentation

## 2012-07-30 DIAGNOSIS — G8929 Other chronic pain: Secondary | ICD-10-CM | POA: Insufficient documentation

## 2012-07-30 DIAGNOSIS — F3289 Other specified depressive episodes: Secondary | ICD-10-CM | POA: Insufficient documentation

## 2012-07-30 DIAGNOSIS — K029 Dental caries, unspecified: Secondary | ICD-10-CM | POA: Insufficient documentation

## 2012-07-30 DIAGNOSIS — F411 Generalized anxiety disorder: Secondary | ICD-10-CM | POA: Insufficient documentation

## 2012-07-30 DIAGNOSIS — Z79899 Other long term (current) drug therapy: Secondary | ICD-10-CM | POA: Insufficient documentation

## 2012-07-30 DIAGNOSIS — I252 Old myocardial infarction: Secondary | ICD-10-CM | POA: Insufficient documentation

## 2012-07-30 DIAGNOSIS — Z8719 Personal history of other diseases of the digestive system: Secondary | ICD-10-CM | POA: Insufficient documentation

## 2012-07-30 DIAGNOSIS — F329 Major depressive disorder, single episode, unspecified: Secondary | ICD-10-CM | POA: Insufficient documentation

## 2012-07-30 DIAGNOSIS — I10 Essential (primary) hypertension: Secondary | ICD-10-CM | POA: Insufficient documentation

## 2012-07-30 DIAGNOSIS — J449 Chronic obstructive pulmonary disease, unspecified: Secondary | ICD-10-CM | POA: Insufficient documentation

## 2012-07-30 MED ORDER — OXYCODONE-ACETAMINOPHEN 5-325 MG PO TABS
1.0000 | ORAL_TABLET | Freq: Once | ORAL | Status: AC
  Administered 2012-07-31: 1 via ORAL
  Filled 2012-07-30: qty 1

## 2012-07-30 NOTE — ED Notes (Signed)
Worse toothache pain on lt. Upper and lt. Lower - backside; pain is radiating to lt. Year and down lt. Lateral neck. Feels like i have the mumps. Feels a knot under lt. Jaw.

## 2012-07-30 NOTE — ED Provider Notes (Signed)
History     CSN: [id number]  Arrival date & time 07/30/12  2141   First MD Initiated Contact with Patient 07/30/12 2338      Chief Complaint  Patient presents with  . Dental Pain    (Consider location/radiation/quality/duration/timing/severity/associated sxs/prior treatment) HPI Comments: Patient with 2 teeth that are broken and causing pain   Has ben taking Vicodin for his chronic back pain without any relief of his dental pain     Patient is a 47 y.o. male presenting with tooth pain. The history is provided by the patient.  Dental PainThe primary symptoms include mouth pain. Primary symptoms do not include dental injury, headaches or fever. The symptoms began more than 1 month ago. The symptoms are worsening. The symptoms occur constantly.  Additional symptoms include: gum swelling, jaw pain and ear pain. Additional symptoms do not include: gum tenderness, purulent gums, trismus, facial swelling, trouble swallowing and pain with swallowing.    Past Medical History  Diagnosis Date  . Chronic back pain   . Myocardial infarction   . Angina   . Hypertension   . COPD (chronic obstructive pulmonary disease)   . Shortness of breath   . Recurrent upper respiratory infection (URI)   . GERD (gastroesophageal reflux disease)   . H/O hiatal hernia   . Headache   . Arthritis   . Mental disorder   . Anxiety   . Depression     Past Surgical History  Procedure Laterality Date  . Cardiac catheterization    . Hernia repair    . Back surgery    . Coronary angioplasty      No family history on file.  History  Substance Use Topics  . Smoking status: Current Every Day Smoker -- 1.00 packs/day for 31 years  . Smokeless tobacco: Not on file  . Alcohol Use: No      Review of Systems  Constitutional: Negative for fever.  HENT: Positive for ear pain and dental problem. Negative for facial swelling, mouth sores, trouble swallowing and ear discharge.   Neurological: Negative for  dizziness and headaches.  All other systems reviewed and are negative.    Allergies  Review of patient's allergies indicates no known allergies.  Home Medications   Current Outpatient Rx  Name  Route  Sig  Dispense  Refill  . ALPRAZolam (XANAX) 1 MG tablet   Oral   Take 1 mg by mouth 4 (four) times daily - after meals and at bedtime. For anxiety         . Aspirin-Acetaminophen-Caffeine (GOODY HEADACHE PO)   Oral   Take 2 packets by mouth daily as needed (for chronic headaches). For pain         . budesonide-formoterol (SYMBICORT) 160-4.5 MCG/ACT inhaler   Inhalation   Inhale 2 puffs into the lungs 2 (two) times daily.         Marland Kitchen HYDROcodone-acetaminophen (NORCO) 10-325 MG per tablet   Oral   Take 1 tablet by mouth every 8 (eight) hours. For pain         . oxyCODONE-acetaminophen (PERCOCET/ROXICET) 5-325 MG per tablet   Oral   Take 1 tablet by mouth every 4 (four) hours as needed for pain.   20 tablet   0   . oxyCODONE-acetaminophen (PERCOCET/ROXICET) 5-325 MG per tablet   Oral   Take 1 tablet by mouth every 6 (six) hours as needed for pain.   9 tablet   0     BP 143/67  Pulse 86  Temp(Src) 98.4 F (36.9 C) (Oral)  Resp 20  SpO2 94%  Physical Exam  Nursing note and vitals reviewed. Constitutional: He appears well-developed.  HENT:  Head: Normocephalic.  Right Ear: External ear normal.  Mouth/Throat:    cavity  Eyes: Pupils are equal, round, and reactive to light.  Neck: Normal range of motion.  Cardiovascular: Normal rate.   Pulmonary/Chest: Effort normal.  Musculoskeletal: Normal range of motion.  Lymphadenopathy:    He has no cervical adenopathy.  Neurological: He is alert.  Skin: Skin is warm and dry.    ED Course  Procedures (including critical care time)  Labs Reviewed - No data to display No results found.   1. Dental cavity       MDM   Will refer to DDS         Arman Filter, NP 07/31/12 0001

## 2012-07-31 MED ORDER — OXYCODONE-ACETAMINOPHEN 5-325 MG PO TABS: 1.0000 | ORAL_TABLET | Freq: Four times a day (QID) | ORAL | Status: DC | PRN

## 2012-07-31 NOTE — ED Provider Notes (Signed)
Medical screening examination/treatment/procedure(s) were performed by non-physician practitioner and as supervising physician I was immediately available for consultation/collaboration.  Olivia Mackie, MD 07/31/12 [phone]

## 2012-08-13 ENCOUNTER — Emergency Department (HOSPITAL_COMMUNITY)
Admission: EM | Admit: 2012-08-13 | Discharge: 2012-08-14 | Disposition: A | Discharge status: Home / Self Care | Payer: Medicaid Other | Attending: Emergency Medicine | Admitting: Emergency Medicine

## 2012-08-13 ENCOUNTER — Emergency Department (HOSPITAL_COMMUNITY): Payer: Medicaid Other

## 2012-08-13 ENCOUNTER — Encounter (HOSPITAL_COMMUNITY): Payer: Self-pay | Admitting: Emergency Medicine

## 2012-08-13 DIAGNOSIS — Z9861 Coronary angioplasty status: Secondary | ICD-10-CM | POA: Insufficient documentation

## 2012-08-13 DIAGNOSIS — Z8659 Personal history of other mental and behavioral disorders: Secondary | ICD-10-CM | POA: Insufficient documentation

## 2012-08-13 DIAGNOSIS — Z8679 Personal history of other diseases of the circulatory system: Secondary | ICD-10-CM | POA: Insufficient documentation

## 2012-08-13 DIAGNOSIS — Z8719 Personal history of other diseases of the digestive system: Secondary | ICD-10-CM | POA: Insufficient documentation

## 2012-08-13 DIAGNOSIS — F3289 Other specified depressive episodes: Secondary | ICD-10-CM | POA: Insufficient documentation

## 2012-08-13 DIAGNOSIS — R079 Chest pain, unspecified: Secondary | ICD-10-CM

## 2012-08-13 DIAGNOSIS — M6281 Muscle weakness (generalized): Secondary | ICD-10-CM | POA: Insufficient documentation

## 2012-08-13 DIAGNOSIS — F17200 Nicotine dependence, unspecified, uncomplicated: Secondary | ICD-10-CM | POA: Insufficient documentation

## 2012-08-13 DIAGNOSIS — M549 Dorsalgia, unspecified: Secondary | ICD-10-CM | POA: Insufficient documentation

## 2012-08-13 DIAGNOSIS — I10 Essential (primary) hypertension: Secondary | ICD-10-CM | POA: Insufficient documentation

## 2012-08-13 DIAGNOSIS — F329 Major depressive disorder, single episode, unspecified: Secondary | ICD-10-CM | POA: Insufficient documentation

## 2012-08-13 DIAGNOSIS — G8929 Other chronic pain: Secondary | ICD-10-CM

## 2012-08-13 DIAGNOSIS — M129 Arthropathy, unspecified: Secondary | ICD-10-CM | POA: Insufficient documentation

## 2012-08-13 DIAGNOSIS — J4489 Other specified chronic obstructive pulmonary disease: Secondary | ICD-10-CM | POA: Insufficient documentation

## 2012-08-13 DIAGNOSIS — Z79899 Other long term (current) drug therapy: Secondary | ICD-10-CM | POA: Insufficient documentation

## 2012-08-13 DIAGNOSIS — R4789 Other speech disturbances: Secondary | ICD-10-CM | POA: Insufficient documentation

## 2012-08-13 DIAGNOSIS — F411 Generalized anxiety disorder: Secondary | ICD-10-CM | POA: Insufficient documentation

## 2012-08-13 DIAGNOSIS — J449 Chronic obstructive pulmonary disease, unspecified: Secondary | ICD-10-CM | POA: Insufficient documentation

## 2012-08-13 DIAGNOSIS — I252 Old myocardial infarction: Secondary | ICD-10-CM | POA: Insufficient documentation

## 2012-08-13 LAB — URINALYSIS, ROUTINE W REFLEX MICROSCOPIC
Glucose, UA: NEGATIVE mg/dL
Ketones, ur: NEGATIVE mg/dL
Leukocytes, UA: NEGATIVE
Specific Gravity, Urine: 1.033 — ABNORMAL HIGH (ref 1.005–1.030)
pH: 5 (ref 5.0–8.0)

## 2012-08-13 LAB — BASIC METABOLIC PANEL
BUN: 15 mg/dL (ref 6–23)
CO2: 20 mEq/L (ref 19–32)
Calcium: 9.7 mg/dL (ref 8.4–10.5)
GFR calc non Af Amer: >90 mL/min (ref >90)
Glucose, Bld: 208 mg/dL — ABNORMAL HIGH (ref 70–99)
Potassium: 4 mEq/L (ref 3.5–5.1)

## 2012-08-13 LAB — CBC
HCT: 46.9 % (ref 39.0–52.0)
Hemoglobin: 16.4 g/dL (ref 13.0–17.0)
MCH: 30.1 pg (ref 26.0–34.0)
MCHC: 35 g/dL (ref 30.0–36.0)

## 2012-08-13 NOTE — ED Notes (Addendum)
BIB family. Patient c/o CP. Patient states Hx of MI, TIA. No SOB, stroke symptoms. Patient in wheelchair. States not able to ambulate at this time Also c/o slurred speech, recurrent over past few days

## 2012-08-13 NOTE — ED Notes (Signed)
Pt expressed that he was diagnosed with anxiety by PCP and takes medication daily for it; however, he has experienced increased agitation and anxiety over the past month due to father's death approximately 11 yrs ago this month.  Pt has experienced stroke-like symptoms and increased inability to rest at night.

## 2012-08-14 MED ORDER — OXYCODONE-ACETAMINOPHEN 5-325 MG PO TABS
2.0000 | ORAL_TABLET | Freq: Once | ORAL | Status: AC
  Administered 2012-08-14: 2 via ORAL
  Filled 2012-08-14: qty 2

## 2012-08-14 NOTE — ED Provider Notes (Signed)
History     CSN: [id number]  Arrival date & time 08/13/12  1553   First MD Initiated Contact with Patient 08/13/12 2121      Chief Complaint  Patient presents with  . Chest Pain    (Consider location/radiation/quality/duration/timing/severity/associated sxs/prior treatment) Patient is a 47 y.o. male presenting with chest pain. The history is provided by the patient. No language interpreter was used.  Chest Pain Pain location:  Substernal area Pain quality: aching and pressure   Pain radiates to:  Does not radiate Associated symptoms: weakness   Associated symptoms: no fever   Associated symptoms comment:  He reports chest pain that is substernal and pressurelike and was concerned because it feels like previous symptoms with heart attack. He has has recurrent symptoms over several days.  Weakness:    Severity:  Moderate   Progression: He reports persistently recurrent symptoms of lower extremity weakness bilaterally and periods of speech difficulty.   Past Medical History  Diagnosis Date  . Chronic back pain   . Myocardial infarction   . Angina   . Hypertension   . COPD (chronic obstructive pulmonary disease)   . Shortness of breath   . Recurrent upper respiratory infection (URI)   . GERD (gastroesophageal reflux disease)   . H/O hiatal hernia   . Headache   . Arthritis   . Mental disorder   . Anxiety   . Depression     Past Surgical History  Procedure Laterality Date  . Cardiac catheterization    . Hernia repair    . Back surgery    . Coronary angioplasty      History reviewed. No pertinent family history.  History  Substance Use Topics  . Smoking status: Current Every Day Smoker -- 1.00 packs/day for 31 years  . Smokeless tobacco: Not on file  . Alcohol Use: No      Review of Systems  Constitutional: Negative for fever and chills.  Respiratory: Negative.   Cardiovascular: Positive for chest pain.  Gastrointestinal: Negative.   Musculoskeletal:  Negative.   Skin: Negative.   Neurological: Positive for speech difficulty and weakness.    Allergies  Review of patient's allergies indicates no known allergies.  Home Medications   Current Outpatient Rx  Name  Route  Sig  Dispense  Refill  . ALPRAZolam (XANAX) 1 MG tablet   Oral   Take 1 mg by mouth 4 (four) times daily - after meals and at bedtime. For anxiety         . Aspirin-Acetaminophen-Caffeine (GOODY HEADACHE PO)   Oral   Take 2 packets by mouth daily as needed (for chronic headaches). For pain         . budesonide-formoterol (SYMBICORT) 160-4.5 MCG/ACT inhaler   Inhalation   Inhale 2 puffs into the lungs 2 (two) times daily.         Marland Kitchen HYDROcodone-acetaminophen (NORCO) 10-325 MG per tablet   Oral   Take 1 tablet by mouth every 8 (eight) hours. For pain         . oxyCODONE-acetaminophen (PERCOCET/ROXICET) 5-325 MG per tablet   Oral   Take 1 tablet by mouth every 6 (six) hours as needed for pain.   9 tablet   0     BP 125/74  Pulse 72  Temp(Src) 97.3 F (36.3 C) (Oral)  Resp 18  SpO2 96%  Physical Exam  Constitutional: He is oriented to person, place, and time. He appears well-developed and well-nourished.  HENT:  Head:  Normocephalic.  Neck: Normal range of motion. Neck supple.  Cardiovascular: Normal rate and regular rhythm.   Pulmonary/Chest: Effort normal and breath sounds normal. He has no wheezes. He has no rales.  Abdominal: Soft. Bowel sounds are normal. There is no tenderness. There is no rebound and no guarding.  Musculoskeletal: Normal range of motion. He exhibits no edema.  Neurological: He is alert and oriented to person, place, and time.  Speech is clear and coherent. Nonfocal neurologic exam. Equal reflexes bilaterally. Equal strength.   Skin: Skin is warm and dry. No rash noted.  Psychiatric: He has a normal mood and affect.    ED Course  Procedures (including critical care time)  Labs Reviewed  CBC - Abnormal; Notable for  the following:    WBC 15.9 (*)    All other components within normal limits  BASIC METABOLIC PANEL - Abnormal; Notable for the following:    Glucose, Bld 208 (*)    All other components within normal limits  URINALYSIS, ROUTINE W REFLEX MICROSCOPIC - Abnormal; Notable for the following:    Color, Urine AMBER (*)    APPearance CLOUDY (*)    Specific Gravity, Urine 1.033 (*)    Bilirubin Urine SMALL (*)    All other components within normal limits  POCT I-STAT TROPONIN I   Dg Chest 2 View  08/13/2012   *RADIOLOGY REPORT*  Clinical Data: Chest and arm pain.  Previous myocardial infarct.  CHEST - 2 VIEW  Comparison:  11/20/2011  Findings:  The heart size and mediastinal contours are within normal limits.  Both lungs are clear.  The visualized skeletal structures are unremarkable.  IMPRESSION: No active cardiopulmonary disease.   Original Report Authenticated By: Myles Rosenthal, M.D.     No diagnosis found. 1. Chest pain, chronic 2. H/o CVA, CAD   MDM  Tests are largely negative. The patient is requesting food and pain medication. VSS. Stable for discharge.        Arnoldo Hooker, PA-C 08/14/12 0024

## 2012-08-14 NOTE — ED Provider Notes (Signed)
Medical screening examination/treatment/procedure(s) were performed by non-physician practitioner and as supervising physician I was immediately available for consultation/collaboration.  Parish Dubose T Jay Haskew, MD 08/14/12 2307 

## 2013-01-13 ENCOUNTER — Encounter (HOSPITAL_COMMUNITY): Payer: Self-pay | Admitting: Emergency Medicine

## 2013-01-13 ENCOUNTER — Emergency Department (HOSPITAL_COMMUNITY)
Admission: EM | Admit: 2013-01-13 | Discharge: 2013-01-13 | Disposition: A | Discharge status: Home / Self Care | Payer: No Typology Code available for payment source | Attending: Emergency Medicine | Admitting: Emergency Medicine

## 2013-01-13 ENCOUNTER — Emergency Department (HOSPITAL_COMMUNITY): Payer: No Typology Code available for payment source

## 2013-01-13 DIAGNOSIS — Z79899 Other long term (current) drug therapy: Secondary | ICD-10-CM | POA: Insufficient documentation

## 2013-01-13 DIAGNOSIS — IMO0002 Reserved for concepts with insufficient information to code with codable children: Secondary | ICD-10-CM | POA: Insufficient documentation

## 2013-01-13 DIAGNOSIS — Y92410 Unspecified street and highway as the place of occurrence of the external cause: Secondary | ICD-10-CM | POA: Insufficient documentation

## 2013-01-13 DIAGNOSIS — F329 Major depressive disorder, single episode, unspecified: Secondary | ICD-10-CM | POA: Insufficient documentation

## 2013-01-13 DIAGNOSIS — S0993XA Unspecified injury of face, initial encounter: Secondary | ICD-10-CM | POA: Insufficient documentation

## 2013-01-13 DIAGNOSIS — J4489 Other specified chronic obstructive pulmonary disease: Secondary | ICD-10-CM | POA: Insufficient documentation

## 2013-01-13 DIAGNOSIS — Z8719 Personal history of other diseases of the digestive system: Secondary | ICD-10-CM | POA: Insufficient documentation

## 2013-01-13 DIAGNOSIS — F17200 Nicotine dependence, unspecified, uncomplicated: Secondary | ICD-10-CM | POA: Insufficient documentation

## 2013-01-13 DIAGNOSIS — I209 Angina pectoris, unspecified: Secondary | ICD-10-CM | POA: Insufficient documentation

## 2013-01-13 DIAGNOSIS — Z8739 Personal history of other diseases of the musculoskeletal system and connective tissue: Secondary | ICD-10-CM | POA: Insufficient documentation

## 2013-01-13 DIAGNOSIS — Z9861 Coronary angioplasty status: Secondary | ICD-10-CM | POA: Insufficient documentation

## 2013-01-13 DIAGNOSIS — G8929 Other chronic pain: Secondary | ICD-10-CM | POA: Insufficient documentation

## 2013-01-13 DIAGNOSIS — F3289 Other specified depressive episodes: Secondary | ICD-10-CM | POA: Insufficient documentation

## 2013-01-13 DIAGNOSIS — F411 Generalized anxiety disorder: Secondary | ICD-10-CM | POA: Insufficient documentation

## 2013-01-13 DIAGNOSIS — I252 Old myocardial infarction: Secondary | ICD-10-CM | POA: Insufficient documentation

## 2013-01-13 DIAGNOSIS — I10 Essential (primary) hypertension: Secondary | ICD-10-CM | POA: Insufficient documentation

## 2013-01-13 DIAGNOSIS — J449 Chronic obstructive pulmonary disease, unspecified: Secondary | ICD-10-CM | POA: Insufficient documentation

## 2013-01-13 DIAGNOSIS — Y939 Activity, unspecified: Secondary | ICD-10-CM | POA: Insufficient documentation

## 2013-01-13 MED ORDER — METHOCARBAMOL 500 MG PO TABS
500.0000 mg | ORAL_TABLET | Freq: Once | ORAL | Status: AC
  Administered 2013-01-13: 500 mg via ORAL
  Filled 2013-01-13: qty 1

## 2013-01-13 MED ORDER — METHOCARBAMOL 500 MG PO TABS: 500.0000 mg | ORAL_TABLET | Freq: Two times a day (BID) | ORAL | Status: DC

## 2013-01-13 MED ORDER — LORAZEPAM 1 MG PO TABS
1.0000 mg | ORAL_TABLET | Freq: Once | ORAL | Status: AC
  Administered 2013-01-13: 1 mg via ORAL
  Filled 2013-01-13: qty 2

## 2013-01-13 MED ORDER — HYDROCODONE-ACETAMINOPHEN 5-325 MG PO TABS
2.0000 | ORAL_TABLET | Freq: Once | ORAL | Status: AC
  Administered 2013-01-13: 2 via ORAL
  Filled 2013-01-13: qty 2

## 2013-01-13 NOTE — ED Notes (Signed)
PA at bedside.

## 2013-01-13 NOTE — ED Notes (Signed)
Patient transported to X-ray 
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
## 2013-01-13 NOTE — ED Notes (Signed)
Patient has ride home with family 

## 2013-01-13 NOTE — ED Notes (Signed)
Patient returned from X-ray 

## 2013-01-17 NOTE — ED Provider Notes (Signed)
Medical screening examination/treatment/procedure(s) were performed by non-physician practitioner and as supervising physician I was immediately available for consultation/collaboration.  EKG Interpretation   None         Tonda Wiederhold, MD 01/17/13 0659 

## 2013-03-16 ENCOUNTER — Emergency Department (HOSPITAL_COMMUNITY)
Admission: EM | Admit: 2013-03-16 | Discharge: 2013-03-16 | Disposition: A | Discharge status: Home / Self Care | Payer: Medicaid Other | Attending: Emergency Medicine | Admitting: Emergency Medicine

## 2013-03-16 ENCOUNTER — Emergency Department (HOSPITAL_COMMUNITY): Payer: Medicaid Other

## 2013-03-16 ENCOUNTER — Encounter (HOSPITAL_COMMUNITY): Payer: Self-pay | Admitting: Emergency Medicine

## 2013-03-16 DIAGNOSIS — G8929 Other chronic pain: Secondary | ICD-10-CM | POA: Insufficient documentation

## 2013-03-16 DIAGNOSIS — J449 Chronic obstructive pulmonary disease, unspecified: Secondary | ICD-10-CM | POA: Insufficient documentation

## 2013-03-16 DIAGNOSIS — J4489 Other specified chronic obstructive pulmonary disease: Secondary | ICD-10-CM | POA: Insufficient documentation

## 2013-03-16 DIAGNOSIS — I10 Essential (primary) hypertension: Secondary | ICD-10-CM | POA: Insufficient documentation

## 2013-03-16 DIAGNOSIS — G40409 Other generalized epilepsy and epileptic syndromes, not intractable, without status epilepticus: Secondary | ICD-10-CM

## 2013-03-16 DIAGNOSIS — F17200 Nicotine dependence, unspecified, uncomplicated: Secondary | ICD-10-CM | POA: Insufficient documentation

## 2013-03-16 DIAGNOSIS — Z9861 Coronary angioplasty status: Secondary | ICD-10-CM | POA: Insufficient documentation

## 2013-03-16 DIAGNOSIS — M129 Arthropathy, unspecified: Secondary | ICD-10-CM | POA: Insufficient documentation

## 2013-03-16 DIAGNOSIS — F411 Generalized anxiety disorder: Secondary | ICD-10-CM | POA: Insufficient documentation

## 2013-03-16 DIAGNOSIS — G4700 Insomnia, unspecified: Secondary | ICD-10-CM

## 2013-03-16 DIAGNOSIS — F329 Major depressive disorder, single episode, unspecified: Secondary | ICD-10-CM | POA: Insufficient documentation

## 2013-03-16 DIAGNOSIS — G40309 Generalized idiopathic epilepsy and epileptic syndromes, not intractable, without status epilepticus: Secondary | ICD-10-CM | POA: Insufficient documentation

## 2013-03-16 DIAGNOSIS — Z8719 Personal history of other diseases of the digestive system: Secondary | ICD-10-CM | POA: Insufficient documentation

## 2013-03-16 DIAGNOSIS — F3289 Other specified depressive episodes: Secondary | ICD-10-CM | POA: Insufficient documentation

## 2013-03-16 DIAGNOSIS — Z79899 Other long term (current) drug therapy: Secondary | ICD-10-CM | POA: Insufficient documentation

## 2013-03-16 DIAGNOSIS — I252 Old myocardial infarction: Secondary | ICD-10-CM | POA: Insufficient documentation

## 2013-03-16 HISTORY — DX: Unspecified convulsions: R56.9

## 2013-03-16 LAB — CBC WITH DIFFERENTIAL/PLATELET
Basophils Absolute: 0.1 10*3/uL (ref 0.0–0.1)
Basophils Relative: 1 % (ref 0–1)
Eosinophils Absolute: 0.2 10*3/uL (ref 0.0–0.7)
Eosinophils Relative: 1 % (ref 0–5)
MCH: 31 pg (ref 26.0–34.0)
MCHC: 35.1 g/dL (ref 30.0–36.0)
Neutrophils Relative %: 78 % — ABNORMAL HIGH (ref 43–77)
Platelets: 263 10*3/uL (ref 150–400)
RBC: 5.07 MIL/uL (ref 4.22–5.81)
RDW: 12.7 % (ref 11.5–15.5)

## 2013-03-16 LAB — URINALYSIS, ROUTINE W REFLEX MICROSCOPIC
Bilirubin Urine: NEGATIVE
Glucose, UA: NEGATIVE mg/dL
Ketones, ur: NEGATIVE mg/dL
Leukocytes, UA: NEGATIVE
Nitrite: NEGATIVE
Specific Gravity, Urine: 1.021 (ref 1.005–1.030)
pH: 5.5 (ref 5.0–8.0)

## 2013-03-16 LAB — RAPID URINE DRUG SCREEN, HOSP PERFORMED
Benzodiazepines: POSITIVE — AB
Cocaine: NOT DETECTED
Opiates: NOT DETECTED

## 2013-03-16 LAB — BASIC METABOLIC PANEL
Calcium: 9.4 mg/dL (ref 8.4–10.5)
GFR calc Af Amer: >90 mL/min (ref >90)
GFR calc non Af Amer: 87 mL/min — ABNORMAL LOW (ref >90)
Potassium: 3.5 mEq/L (ref 3.5–5.1)
Sodium: 138 mEq/L (ref 135–145)

## 2013-03-16 LAB — URINE MICROSCOPIC-ADD ON

## 2013-03-16 MED ORDER — LORAZEPAM 2 MG/ML IJ SOLN
1.0000 mg | Freq: Once | INTRAMUSCULAR | Status: AC
  Administered 2013-03-16: 1 mg via INTRAVENOUS
  Filled 2013-03-16: qty 1

## 2013-03-16 MED ORDER — ONDANSETRON HCL 4 MG/2ML IJ SOLN
INTRAMUSCULAR | Status: AC
  Filled 2013-03-16: qty 2

## 2013-03-16 MED ORDER — HYDROMORPHONE HCL PF 1 MG/ML IJ SOLN
1.0000 mg | Freq: Once | INTRAMUSCULAR | Status: AC
  Administered 2013-03-16: 1 mg via INTRAVENOUS
  Filled 2013-03-16: qty 1

## 2013-03-16 MED ORDER — LORAZEPAM 2 MG PO TABS: 2.0000 mg | ORAL_TABLET | Freq: Every day | ORAL | Status: DC

## 2013-03-16 MED ORDER — ONDANSETRON HCL 4 MG/2ML IJ SOLN
4.0000 mg | Freq: Once | INTRAMUSCULAR | Status: AC
  Administered 2013-03-16: 4 mg via INTRAVENOUS

## 2013-03-16 NOTE — ED Provider Notes (Signed)
CSN: [id number]     Arrival date & time 03/16/13  1038 History   First MD Initiated Contact with Patient 03/16/13 1108     Chief Complaint  Patient presents with  . Seizures   (Consider location/radiation/quality/duration/timing/severity/associated sxs/prior Treatment) Patient is a 47 y.o. male presenting with seizures. The history is provided by the patient and the spouse.  Seizures Seizure activity on arrival: no   Seizure type:  Grand mal Preceding symptoms comment:  None Initial focality:  None Episode characteristics: generalized shaking and stiffening   Episode characteristics: no incontinence and no tongue biting   Episode characteristics comment:  Initially spouse states his neck stiffened and it looked like he was looking behind him but then started to have generalized shaking Postictal symptoms: confusion   Return to baseline: yes   Severity:  Moderate Duration:  3 minutes Timing:  Once Number of seizures this episode:  1 Progression:  Resolved Context: decreased sleep   Context: not alcohol withdrawal, not drug use and not emotional upset   Recent head injury:  No recent head injuries PTA treatment:  None History of seizures: yes   Date of initial seizure episode:  States over the last few years he has had 15-20 sz but no definitive cause Current therapy:  None   Past Medical History  Diagnosis Date  . Chronic back pain   . Myocardial infarction   . Angina   . Hypertension   . COPD (chronic obstructive pulmonary disease)   . Shortness of breath   . Recurrent upper respiratory infection (URI)   . GERD (gastroesophageal reflux disease)   . H/O hiatal hernia   . Headache(784.0)   . Arthritis   . Mental disorder   . Anxiety   . Depression   . Seizures    Past Surgical History  Procedure Laterality Date  . Cardiac catheterization    . Hernia repair    . Back surgery    . Coronary angioplasty     History reviewed. No pertinent family history. History   Substance Use Topics  . Smoking status: Current Every Day Smoker -- 1.00 packs/day for 31 years  . Smokeless tobacco: Not on file  . Alcohol Use: No    Review of Systems  Neurological: Positive for seizures.  All other systems reviewed and are negative.    Allergies  Review of patient's allergies indicates no known allergies.  Home Medications   Current Outpatient Rx  Name  Route  Sig  Dispense  Refill  . ALPRAZolam (XANAX) 1 MG tablet   Oral   Take 1 mg by mouth 4 (four) times daily - after meals and at bedtime. For anxiety         . Aspirin-Acetaminophen-Caffeine (GOODY HEADACHE PO)   Oral   Take 2 packets by mouth daily as needed (for chronic headaches). For pain         . budesonide-formoterol (SYMBICORT) 160-4.5 MCG/ACT inhaler   Inhalation   Inhale 2 puffs into the lungs 2 (two) times daily.         Marland Kitchen dipyridamole-aspirin (AGGRENOX) 200-25 MG per 12 hr capsule   Oral   Take 1 capsule by mouth daily.         Marland Kitchen HYDROcodone-acetaminophen (NORCO) 10-325 MG per tablet   Oral   Take 1 tablet by mouth every 8 (eight) hours. For pain         . methocarbamol (ROBAXIN) 500 MG tablet   Oral   Take 1  tablet (500 mg total) by mouth 2 (two) times daily.   20 tablet   0   . oxyCODONE-acetaminophen (PERCOCET/ROXICET) 5-325 MG per tablet   Oral   Take 1 tablet by mouth every 6 (six) hours as needed for pain.   9 tablet   0    BP 104/71  Pulse 90  Temp(Src) 98.4 F (36.9 C) (Oral)  Resp 17  Ht 6\' 2"  (1.88 m)  Wt 243 lb (110.224 kg)  BMI 31.19 kg/m2  SpO2 94% Physical Exam  Nursing note and vitals reviewed. Constitutional: He is oriented to person, place, and time. He appears well-developed and well-nourished. No distress.  HENT:  Head: Normocephalic and atraumatic.  Mouth/Throat: Oropharynx is clear and moist.  Eyes: Conjunctivae and EOM are normal. Pupils are equal, round, and reactive to light.  Neck: Normal range of motion. Neck supple.   Cardiovascular: Normal rate, regular rhythm and intact distal pulses.   No murmur heard. Pulmonary/Chest: Effort normal and breath sounds normal. No respiratory distress. He has no wheezes. He has no rales.  Abdominal: Soft. He exhibits no distension. There is no tenderness. There is no rebound and no guarding.  Musculoskeletal: Normal range of motion. He exhibits no edema and no tenderness.  Neurological: He is alert and oriented to person, place, and time. He has normal strength. No cranial nerve deficit or sensory deficit. Coordination normal.  Skin: Skin is warm and dry. No rash noted. No erythema.  Psychiatric: He has a normal mood and affect. His behavior is normal.    ED Course  Procedures (including critical care time) Labs Review Labs Reviewed  CBC WITH DIFFERENTIAL - Abnormal; Notable for the following:    WBC 13.9 (*)    Neutrophils Relative % 78 (*)    Neutro Abs 10.8 (*)    All other components within normal limits  BASIC METABOLIC PANEL - Abnormal; Notable for the following:    CO2 17 (*)    Glucose, Bld 140 (*)    GFR calc non Af Amer 87 (*)    All other components within normal limits  URINE RAPID DRUG SCREEN (HOSP PERFORMED)  URINALYSIS, ROUTINE W REFLEX MICROSCOPIC   Imaging Review Ct Head Wo Contrast  03/16/2013   CLINICAL DATA:  Seizure.  Personal history of seizures.  EXAM: CT HEAD WITHOUT CONTRAST  TECHNIQUE: Contiguous axial images were obtained from the base of the skull through the vertex without intravenous contrast.  COMPARISON:  MRI 08/17/2012.  08/16/2011 head CT.  FINDINGS: No mass lesion, mass effect, midline shift, hydrocephalus, hemorrhage. No territorial ischemia or acute infarction. Paranasal sinuses and mastoid air cells appear within normal limits.  IMPRESSION: Negative CT head.   Electronically Signed   By: Andreas Newport M.D.   On: 03/16/2013 11:55    EKG Interpretation    Date/Time:  Saturday March 16 2013 10:51:34 EST Ventricular  Rate:  87 PR Interval:  161 QRS Duration: 98 QT Interval:  364 QTC Calculation: 438 R Axis:   61 Text Interpretation:  Sinus rhythm LAE, consider biatrial enlargement Probable lateral infarct, old No significant change since last tracing Confirmed by Cookie Pore  MD, Addis Tuohy ([id number]) on 03/16/2013 11:08:02 AM            MDM  No diagnosis found.  Pt with a seizure today while resting on the couch.  Pt has insomnia and has not slept for 9 days.  This is most likely the cause of his sz.  No hx of etoh  use and pt has been taking xanax regularly.  Only marijuana use.  He has tried multiple meds for insomnia but nothing helps.  Pt well appearing now without acute deficits.    EKG wnl.  Pt c/o of generalized body pain.  Head CT neg.  CBC, BMP, UA, UDS pending.   Pt given ativan and pain control.  2:46 PM Labs and imaging neg.  Pt feeling better after ativan.  Feels that ativan helped him rest and will give ppx for same.  Gwyneth Sprout, MD 03/16/13 1447

## 2013-03-16 NOTE — ED Notes (Signed)
Patient presents to ED via EMS from home after having seizure this am. EMS gave 4mg  IV zofran for nausea. CBG with EMS 152.

## 2013-03-16 NOTE — ED Notes (Signed)
Patient discharged to home with family. NAD.  

## 2013-03-16 NOTE — ED Notes (Signed)
Seizure pads placed on patients bed rails.

## 2013-07-30 ENCOUNTER — Emergency Department (HOSPITAL_COMMUNITY): Payer: Medicaid Other

## 2013-07-30 ENCOUNTER — Encounter (HOSPITAL_COMMUNITY): Payer: Self-pay | Admitting: Emergency Medicine

## 2013-07-30 ENCOUNTER — Other Ambulatory Visit: Payer: Self-pay

## 2013-07-30 ENCOUNTER — Emergency Department (HOSPITAL_COMMUNITY)
Admission: EM | Admit: 2013-07-30 | Discharge: 2013-07-30 | Disposition: A | Discharge status: Home / Self Care | Payer: Medicaid Other | Attending: Emergency Medicine | Admitting: Emergency Medicine

## 2013-07-30 DIAGNOSIS — R51 Headache: Secondary | ICD-10-CM | POA: Insufficient documentation

## 2013-07-30 DIAGNOSIS — I252 Old myocardial infarction: Secondary | ICD-10-CM | POA: Insufficient documentation

## 2013-07-30 DIAGNOSIS — J4489 Other specified chronic obstructive pulmonary disease: Secondary | ICD-10-CM | POA: Insufficient documentation

## 2013-07-30 DIAGNOSIS — Z8719 Personal history of other diseases of the digestive system: Secondary | ICD-10-CM | POA: Insufficient documentation

## 2013-07-30 DIAGNOSIS — R5383 Other fatigue: Secondary | ICD-10-CM

## 2013-07-30 DIAGNOSIS — M129 Arthropathy, unspecified: Secondary | ICD-10-CM | POA: Insufficient documentation

## 2013-07-30 DIAGNOSIS — Z79899 Other long term (current) drug therapy: Secondary | ICD-10-CM | POA: Insufficient documentation

## 2013-07-30 DIAGNOSIS — R519 Headache, unspecified: Secondary | ICD-10-CM

## 2013-07-30 DIAGNOSIS — R55 Syncope and collapse: Secondary | ICD-10-CM | POA: Insufficient documentation

## 2013-07-30 DIAGNOSIS — R079 Chest pain, unspecified: Secondary | ICD-10-CM | POA: Insufficient documentation

## 2013-07-30 DIAGNOSIS — F17200 Nicotine dependence, unspecified, uncomplicated: Secondary | ICD-10-CM | POA: Insufficient documentation

## 2013-07-30 DIAGNOSIS — F3289 Other specified depressive episodes: Secondary | ICD-10-CM | POA: Insufficient documentation

## 2013-07-30 DIAGNOSIS — G8929 Other chronic pain: Secondary | ICD-10-CM | POA: Insufficient documentation

## 2013-07-30 DIAGNOSIS — J449 Chronic obstructive pulmonary disease, unspecified: Secondary | ICD-10-CM | POA: Insufficient documentation

## 2013-07-30 DIAGNOSIS — F329 Major depressive disorder, single episode, unspecified: Secondary | ICD-10-CM | POA: Insufficient documentation

## 2013-07-30 DIAGNOSIS — F411 Generalized anxiety disorder: Secondary | ICD-10-CM | POA: Insufficient documentation

## 2013-07-30 DIAGNOSIS — I10 Essential (primary) hypertension: Secondary | ICD-10-CM | POA: Insufficient documentation

## 2013-07-30 DIAGNOSIS — R5381 Other malaise: Secondary | ICD-10-CM | POA: Insufficient documentation

## 2013-07-30 DIAGNOSIS — Z7982 Long term (current) use of aspirin: Secondary | ICD-10-CM | POA: Insufficient documentation

## 2013-07-30 DIAGNOSIS — Z9861 Coronary angioplasty status: Secondary | ICD-10-CM | POA: Insufficient documentation

## 2013-07-30 LAB — CBC WITH DIFFERENTIAL/PLATELET
Basophils Absolute: 0.1 10*3/uL (ref 0.0–0.1)
Basophils Relative: 1 % (ref 0–1)
Eosinophils Absolute: 0.2 10*3/uL (ref 0.0–0.7)
Eosinophils Relative: 2 % (ref 0–5)
HCT: 42.3 % (ref 39.0–52.0)
HEMOGLOBIN: 14.3 g/dL (ref 13.0–17.0)
Lymphocytes Relative: 27 % (ref 12–46)
Lymphs Abs: 2.9 10*3/uL (ref 0.7–4.0)
MCH: 29.5 pg (ref 26.0–34.0)
MCHC: 33.8 g/dL (ref 30.0–36.0)
MCV: 87.4 fL (ref 78.0–100.0)
Monocytes Absolute: 0.7 10*3/uL (ref 0.1–1.0)
Monocytes Relative: 7 % (ref 3–12)
NEUTROS ABS: 6.8 10*3/uL (ref 1.7–7.7)
Neutrophils Relative %: 63 % (ref 43–77)
Platelets: 235 10*3/uL (ref 150–400)
RBC: 4.84 MIL/uL (ref 4.22–5.81)
RDW: 12.3 % (ref 11.5–15.5)
WBC: 10.6 10*3/uL — ABNORMAL HIGH (ref 4.0–10.5)

## 2013-07-30 LAB — BASIC METABOLIC PANEL
BUN: 19 mg/dL (ref 6–23)
CHLORIDE: 106 meq/L (ref 96–112)
CO2: 24 mEq/L (ref 19–32)
CREATININE: 1.02 mg/dL (ref 0.50–1.35)
Calcium: 9.1 mg/dL (ref 8.4–10.5)
GFR calc Af Amer: >90 mL/min (ref >90)
GFR calc non Af Amer: 85 mL/min — ABNORMAL LOW (ref >90)
GLUCOSE: 110 mg/dL — AB (ref 70–99)
Potassium: 3.9 mEq/L (ref 3.7–5.3)
Sodium: 142 mEq/L (ref 137–147)

## 2013-07-30 LAB — CK: Total CK: 58 U/L (ref 7–232)

## 2013-07-30 LAB — TROPONIN I

## 2013-07-30 MED ORDER — OXYCODONE-ACETAMINOPHEN 5-325 MG PO TABS
2.0000 | ORAL_TABLET | Freq: Once | ORAL | Status: AC
  Administered 2013-07-30: 2 via ORAL
  Filled 2013-07-30: qty 2

## 2013-07-30 MED ORDER — MORPHINE SULFATE 4 MG/ML IJ SOLN
4.0000 mg | Freq: Once | INTRAMUSCULAR | Status: AC
  Administered 2013-07-30: 4 mg via INTRAVENOUS
  Filled 2013-07-30: qty 1

## 2013-07-30 MED ORDER — SODIUM CHLORIDE 0.9 % IV BOLUS (SEPSIS)
1000.0000 mL | Freq: Once | INTRAVENOUS | Status: AC
  Administered 2013-07-30: 1000 mL via INTRAVENOUS
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
## 2013-07-30 NOTE — Discharge Instructions (Signed)
Your caregiver has diagnosed you as having chest pain that is not specific for one problem, but does not require admission.  Chest pain comes from many different causes.  °SEEK IMMEDIATE MEDICAL ATTENTION IF: °You have severe chest pain, especially if the pain is crushing or pressure-like and spreads to the arms, back, neck, or jaw, or if you have sweating, nausea (feeling sick to your stomach), or shortness of breath. THIS IS AN EMERGENCY. Don't wait to see if the pain will go away. Get medical help at once. Call 911 or 0 (operator). DO NOT drive yourself to the hospital.  °Your chest pain gets worse and does not go away with rest.  °You have an attack of chest pain lasting longer than usual, despite rest and treatment with the medications your caregiver has prescribed.  °You wake from sleep with chest pain or shortness of breath.  °You feel dizzy or faint.  °You have chest pain not typical of your usual pain for which you originally saw your caregiver. ° °You are having a headache. No specific cause was found today for your headache. It may have been a migraine or other cause of headache. Stress, anxiety, fatigue, and depression are common triggers for headaches. Your headache today does not appear to be life-threatening or require hospitalization, but often the exact cause of headaches is not determined in the emergency department. Therefore, follow-up with your doctor is very important to find out what may have caused your headache, and whether or not you need any further diagnostic testing or treatment. Sometimes headaches can appear benign (not harmful), but then more serious symptoms can develop which should prompt an immediate re-evaluation by your doctor or the emergency department. ° °SEEK MEDICAL ATTENTION IF: ° °You develop possible problems with medications prescribed.  °The medications don't resolve your headache, if it recurs , or if you have multiple episodes of vomiting or can't take fluids. °You  have a change from the usual headache. ° °RETURN IMMEDIATELY IF you develop a sudden, severe headache or confusion, become poorly responsive or faint, develop a fever above 100.4F or problem breathing, have a change in speech, vision, swallowing, or understanding, or develop new weakness, numbness, tingling, incoordination, or have a seizure. ° °
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
## 2013-09-24 ENCOUNTER — Emergency Department: Payer: Self-pay | Admitting: Emergency Medicine

## 2013-10-02 ENCOUNTER — Encounter (HOSPITAL_COMMUNITY): Payer: Self-pay | Admitting: Emergency Medicine

## 2013-10-02 ENCOUNTER — Emergency Department (HOSPITAL_COMMUNITY)
Admission: EM | Admit: 2013-10-02 | Discharge: 2013-10-02 | Disposition: A | Discharge status: Home / Self Care | Payer: Medicaid Other | Attending: Emergency Medicine | Admitting: Emergency Medicine

## 2013-10-02 DIAGNOSIS — F3289 Other specified depressive episodes: Secondary | ICD-10-CM | POA: Diagnosis not present

## 2013-10-02 DIAGNOSIS — Z79899 Other long term (current) drug therapy: Secondary | ICD-10-CM | POA: Insufficient documentation

## 2013-10-02 DIAGNOSIS — Z9889 Other specified postprocedural states: Secondary | ICD-10-CM | POA: Diagnosis not present

## 2013-10-02 DIAGNOSIS — Z760 Encounter for issue of repeat prescription: Secondary | ICD-10-CM | POA: Diagnosis present

## 2013-10-02 DIAGNOSIS — F329 Major depressive disorder, single episode, unspecified: Secondary | ICD-10-CM | POA: Diagnosis not present

## 2013-10-02 DIAGNOSIS — Z8719 Personal history of other diseases of the digestive system: Secondary | ICD-10-CM | POA: Insufficient documentation

## 2013-10-02 DIAGNOSIS — F17200 Nicotine dependence, unspecified, uncomplicated: Secondary | ICD-10-CM | POA: Insufficient documentation

## 2013-10-02 DIAGNOSIS — J4489 Other specified chronic obstructive pulmonary disease: Secondary | ICD-10-CM | POA: Insufficient documentation

## 2013-10-02 DIAGNOSIS — J449 Chronic obstructive pulmonary disease, unspecified: Secondary | ICD-10-CM | POA: Diagnosis not present

## 2013-10-02 DIAGNOSIS — I252 Old myocardial infarction: Secondary | ICD-10-CM | POA: Diagnosis not present

## 2013-10-02 DIAGNOSIS — M129 Arthropathy, unspecified: Secondary | ICD-10-CM | POA: Insufficient documentation

## 2013-10-02 DIAGNOSIS — Z8669 Personal history of other diseases of the nervous system and sense organs: Secondary | ICD-10-CM | POA: Diagnosis not present

## 2013-10-02 DIAGNOSIS — F411 Generalized anxiety disorder: Secondary | ICD-10-CM | POA: Diagnosis not present

## 2013-10-02 DIAGNOSIS — G8929 Other chronic pain: Secondary | ICD-10-CM | POA: Insufficient documentation

## 2013-10-02 DIAGNOSIS — I10 Essential (primary) hypertension: Secondary | ICD-10-CM | POA: Diagnosis not present

## 2013-10-02 DIAGNOSIS — Z9861 Coronary angioplasty status: Secondary | ICD-10-CM | POA: Insufficient documentation

## 2013-10-02 MED ORDER — HYDROCODONE-ACETAMINOPHEN 5-325 MG PO TABS: 1.0000 | ORAL_TABLET | Freq: Four times a day (QID) | ORAL | Status: DC | PRN

## 2013-10-02 MED ORDER — ALPRAZOLAM 1 MG PO TABS: 1.0000 mg | ORAL_TABLET | Freq: Three times a day (TID) | ORAL | Status: AC | PRN

## 2013-10-02 MED ORDER — HYDROCODONE-ACETAMINOPHEN 5-325 MG PO TABS
2.0000 | ORAL_TABLET | Freq: Once | ORAL | Status: AC
  Administered 2013-10-02: 2 via ORAL
  Filled 2013-10-02: qty 2

## 2013-10-02 MED ORDER — ALPRAZOLAM 0.25 MG PO TABS
1.0000 mg | ORAL_TABLET | Freq: Once | ORAL | Status: AC
  Administered 2013-10-02: 1 mg via ORAL
  Filled 2013-10-02: qty 4
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
## 2013-10-02 NOTE — Discharge Instructions (Signed)
You need to call your doctor tomorrow to try to be seen sooner than your scheduled appointment.  Medication Refill, Emergency Department We have refilled your medication today as a courtesy to you. It is best for your medical care, however, to take care of getting refills done through your primary caregiver's office. They have your records and can do a better job of follow-up than we can in the emergency department. On maintenance medications, we often only prescribe enough medications to get you by until you are able to see your regular caregiver. This is a more expensive way to refill medications. In the future, please plan for refills so that you will not have to use the emergency department for this. Thank you for your help. Your help allows us to better take care of the daily emergencies that enter our department. Document Released: 07/01/2003 Document Revised: 06/06/2011 Document Reviewed: 03/14/2005 [facility] Patient Information 2015 [employer], [place]. This information is not intended to replace advice given to you by your health care provider. Make sure you discuss any questions you have with your health care provider.
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
## 2013-10-10 NOTE — ED Provider Notes (Signed)
Medical screening examination/treatment/procedure(s) were performed by non-physician practitioner and as supervising physician I was immediately available for consultation/collaboration.   Eloni Darius L Mikah Rottinghaus, MD 10/10/13 1204 

## 2013-10-16 ENCOUNTER — Emergency Department: Payer: Self-pay | Admitting: Emergency Medicine

## 2014-02-11 DIAGNOSIS — J449 Chronic obstructive pulmonary disease, unspecified: Secondary | ICD-10-CM | POA: Diagnosis not present

## 2014-02-11 DIAGNOSIS — Z79899 Other long term (current) drug therapy: Secondary | ICD-10-CM | POA: Diagnosis not present

## 2014-02-11 DIAGNOSIS — Z9861 Coronary angioplasty status: Secondary | ICD-10-CM | POA: Diagnosis not present

## 2014-02-11 DIAGNOSIS — F419 Anxiety disorder, unspecified: Secondary | ICD-10-CM | POA: Diagnosis not present

## 2014-02-11 DIAGNOSIS — Z8719 Personal history of other diseases of the digestive system: Secondary | ICD-10-CM | POA: Diagnosis not present

## 2014-02-11 DIAGNOSIS — G479 Sleep disorder, unspecified: Secondary | ICD-10-CM | POA: Diagnosis not present

## 2014-02-11 DIAGNOSIS — I252 Old myocardial infarction: Secondary | ICD-10-CM | POA: Insufficient documentation

## 2014-02-11 DIAGNOSIS — M1990 Unspecified osteoarthritis, unspecified site: Secondary | ICD-10-CM | POA: Diagnosis not present

## 2014-02-11 DIAGNOSIS — Z9889 Other specified postprocedural states: Secondary | ICD-10-CM | POA: Diagnosis not present

## 2014-02-11 DIAGNOSIS — G8929 Other chronic pain: Secondary | ICD-10-CM | POA: Insufficient documentation

## 2014-02-11 DIAGNOSIS — G43909 Migraine, unspecified, not intractable, without status migrainosus: Secondary | ICD-10-CM | POA: Insufficient documentation

## 2014-02-11 DIAGNOSIS — F329 Major depressive disorder, single episode, unspecified: Secondary | ICD-10-CM | POA: Diagnosis not present

## 2014-02-11 DIAGNOSIS — F1490 Cocaine use, unspecified, uncomplicated: Secondary | ICD-10-CM | POA: Diagnosis not present

## 2014-02-11 DIAGNOSIS — M549 Dorsalgia, unspecified: Secondary | ICD-10-CM | POA: Insufficient documentation

## 2014-02-11 DIAGNOSIS — Z720 Tobacco use: Secondary | ICD-10-CM | POA: Diagnosis not present

## 2014-02-11 DIAGNOSIS — Z008 Encounter for other general examination: Secondary | ICD-10-CM | POA: Diagnosis present

## 2014-02-11 DIAGNOSIS — I10 Essential (primary) hypertension: Secondary | ICD-10-CM | POA: Insufficient documentation

## 2014-02-12 ENCOUNTER — Emergency Department (HOSPITAL_COMMUNITY)
Admission: EM | Admit: 2014-02-12 | Discharge: 2014-02-12 | Disposition: A | Discharge status: Home / Self Care | Payer: Medicaid Other | Attending: Emergency Medicine | Admitting: Emergency Medicine

## 2014-02-12 ENCOUNTER — Encounter (HOSPITAL_COMMUNITY): Payer: Self-pay | Admitting: Emergency Medicine

## 2014-02-12 DIAGNOSIS — F419 Anxiety disorder, unspecified: Secondary | ICD-10-CM

## 2014-02-12 DIAGNOSIS — F1490 Cocaine use, unspecified, uncomplicated: Secondary | ICD-10-CM

## 2014-02-12 DIAGNOSIS — G8929 Other chronic pain: Secondary | ICD-10-CM

## 2014-02-12 DIAGNOSIS — M549 Dorsalgia, unspecified: Secondary | ICD-10-CM

## 2014-02-12 HISTORY — DX: Migraine, unspecified, not intractable, without status migrainosus: G43.909

## 2014-02-12 LAB — RAPID URINE DRUG SCREEN, HOSP PERFORMED
Amphetamines: NOT DETECTED
BARBITURATES: NOT DETECTED
Benzodiazepines: POSITIVE — AB
Cocaine: POSITIVE — AB
Opiates: NOT DETECTED
Tetrahydrocannabinol: NOT DETECTED

## 2014-02-12 LAB — CBC WITH DIFFERENTIAL/PLATELET
Basophils Absolute: 0.1 10*3/uL (ref 0.0–0.1)
Basophils Relative: 1 % (ref 0–1)
EOS ABS: 0.2 10*3/uL (ref 0.0–0.7)
Eosinophils Relative: 3 % (ref 0–5)
HCT: 43.4 % (ref 39.0–52.0)
HEMOGLOBIN: 14.6 g/dL (ref 13.0–17.0)
LYMPHS ABS: 3.2 10*3/uL (ref 0.7–4.0)
Lymphocytes Relative: 34 % (ref 12–46)
MCH: 29.6 pg (ref 26.0–34.0)
MCHC: 33.6 g/dL (ref 30.0–36.0)
MCV: 87.9 fL (ref 78.0–100.0)
MONOS PCT: 8 % (ref 3–12)
Monocytes Absolute: 0.8 10*3/uL (ref 0.1–1.0)
NEUTROS PCT: 54 % (ref 43–77)
Neutro Abs: 5 10*3/uL (ref 1.7–7.7)
Platelets: 194 10*3/uL (ref 150–400)
RBC: 4.94 MIL/uL (ref 4.22–5.81)
RDW: 12.8 % (ref 11.5–15.5)
WBC: 9.2 10*3/uL (ref 4.0–10.5)

## 2014-02-12 LAB — COMPREHENSIVE METABOLIC PANEL
ALK PHOS: 70 U/L (ref 39–117)
ALT: 17 U/L (ref 0–53)
ANION GAP: 12 (ref 5–15)
AST: 19 U/L (ref 0–37)
Albumin: 3.7 g/dL (ref 3.5–5.2)
BUN: 11 mg/dL (ref 6–23)
CHLORIDE: 104 meq/L (ref 96–112)
CO2: 25 mEq/L (ref 19–32)
Calcium: 9.2 mg/dL (ref 8.4–10.5)
Creatinine, Ser: 1.1 mg/dL (ref 0.50–1.35)
GFR, EST AFRICAN AMERICAN: 89 mL/min — AB (ref >90)
GFR, EST NON AFRICAN AMERICAN: 77 mL/min — AB (ref >90)
GLUCOSE: 97 mg/dL (ref 70–99)
Potassium: 3.9 mEq/L (ref 3.7–5.3)
Sodium: 141 mEq/L (ref 137–147)
TOTAL PROTEIN: 6.8 g/dL (ref 6.0–8.3)
Total Bilirubin: 0.3 mg/dL (ref 0.3–1.2)

## 2014-02-12 LAB — ACETAMINOPHEN LEVEL: Acetaminophen (Tylenol), Serum: <15 ug/mL (ref 10–30)

## 2014-02-12 LAB — ETHANOL

## 2014-02-12 LAB — SALICYLATE LEVEL: Salicylate Lvl: <2 mg/dL — ABNORMAL LOW (ref 2.8–20.0)

## 2014-02-12 MED ORDER — ACETAMINOPHEN 325 MG PO TABS
650.0000 mg | ORAL_TABLET | ORAL | Status: DC | PRN
Start: 2014-02-12 — End: 2014-02-12

## 2014-02-12 MED ORDER — IBUPROFEN 200 MG PO TABS: 600.0000 mg | ORAL_TABLET | Freq: Three times a day (TID) | ORAL | Status: DC | PRN

## 2014-02-12 MED ORDER — ONDANSETRON HCL 4 MG PO TABS: 4.0000 mg | ORAL_TABLET | Freq: Three times a day (TID) | ORAL | Status: DC | PRN

## 2014-02-12 MED ORDER — LORAZEPAM 1 MG PO TABS
1.0000 mg | ORAL_TABLET | Freq: Three times a day (TID) | ORAL | Status: DC | PRN
  Administered 2014-02-12: 1 mg via ORAL
  Filled 2014-02-12: qty 1

## 2014-02-12 MED ORDER — ZOLPIDEM TARTRATE 5 MG PO TABS: 5.0000 mg | ORAL_TABLET | Freq: Every evening | ORAL | Status: DC | PRN

## 2014-02-12 MED ORDER — NICOTINE 21 MG/24HR TD PT24
21.0000 mg | MEDICATED_PATCH | Freq: Every day | TRANSDERMAL | Status: DC
  Administered 2014-02-12: 21 mg via TRANSDERMAL
  Filled 2014-02-12: qty 1

## 2014-02-12 NOTE — BH Assessment (Addendum)
Spoke with Vincent MaduraKelly Humes, PA-C prior to initiating assessment. Pt is requesting detox and possible rehab placement for Xanax and opiates, after to 10-15 years of use, feels like he is going through a fog. Reports he has seen many people in his life abusing these drugs, including his wife and he does not want to life like this. No SI/HI. 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 UDS pending, otherwise labs unremarkable per TXU CorpKelly PA-C.  Requested cart be placed with pt for assessment. RN requests about 10 minutes lead time.   Assessment to commence shortly.   Attempted assessment from [phone] cart not working, no volume on cart. Requested peds cart be brought to pt room for assessment. Will attempt assessment again 10 minutes.    Vincent BernhardtNancy Sheily Dennis, [facility] Triage Specialist 02/12/2014 2:26 AM

## 2014-02-12 NOTE — ED Notes (Signed)
The patient said he has been on hydrocodone and xannax and says he is ready to get off the drugs.  The patient said he has been taking these drugs for 10-15 years.  He does not want to take them anymore.  He has not taken any since yesterday.  He says he use to take them "recreationally" and now he gets them prescribed for his anxiety and panic attacks.  He says he is tired of taking them and relying on them to sleep.  He says there has got to be a different way to deal with pain and anxiety.  He denies SI and HI but does want to detox from these drugs.

## 2014-02-12 NOTE — Discharge Instructions (Signed)
Do not discontinue your medications abruptly. Discuss with your primary care provider a plan to taper, and eventually discontinue, your medications safely. Follow up with an outpatient center to discuss possible treatment/placement in a treatment facility. Return to the ED as needed if symptoms worsen.  Generalized Anxiety Disorder Generalized anxiety disorder (GAD) is a mental disorder. It interferes with life functions, including relationships, work, and school. GAD is different from normal anxiety, which everyone experiences at some point in their lives in response to specific life events and activities. Normal anxiety actually helps [place] prepare for and get through these life events and activities. Normal anxiety goes away after the event or activity is over.  GAD causes anxiety that is not necessarily related to specific events or activities. It also causes excess anxiety in proportion to specific events or activities. The anxiety associated with GAD is also difficult to control. GAD can vary from mild to severe. People with severe GAD can have intense waves of anxiety with physical symptoms (panic attacks).  SYMPTOMS The anxiety and worry associated with GAD are difficult to control. This anxiety and worry are related to many life events and activities and also occur more days than not for 6 months or longer. People with GAD also have three or more of the following symptoms (one or more in children):  Restlessness.   Fatigue.  Difficulty concentrating.   Irritability.  Muscle tension.  Difficulty sleeping or unsatisfying sleep. DIAGNOSIS GAD is diagnosed through an assessment by your health care provider. Your health care provider will ask you questions aboutyour mood,physical symptoms, and events in your life. Your health care provider may ask you about your medical history and use of alcohol or drugs, including prescription medicines. Your health care provider may also do a physical  exam and blood tests. Certain medical conditions and the use of certain substances can cause symptoms similar to those associated with GAD. Your health care provider may refer you to a mental health specialist for further evaluation. TREATMENT The following therapies are usually used to treat GAD:   Medication. Antidepressant medication usually is prescribed for long-term daily control. Antianxiety medicines may be added in severe cases, especially when panic attacks occur.   Talk therapy (psychotherapy). Certain types of talk therapy can be helpful in treating GAD by providing support, education, and guidance. A form of talk therapy called cognitive behavioral therapy can teach you healthy ways to think about and react to daily life events and activities.  Stress managementtechniques. These include yoga, meditation, and exercise and can be very helpful when they are practiced regularly. A mental health specialist can help determine which treatment is best for you. Some people see improvement with one therapy. However, other people require a combination of therapies. Document Released: 07/09/2012 Document Revised: 07/29/2013 Document Reviewed: 07/09/2012 [facility] Patient Information 2015 [place], [place]. This information is not intended to replace advice given to you by your health care provider. Make sure you discuss any questions you have with your health care provider.  Emergency Department Resource Guide 1) Find a Doctor and Pay Out of Pocket Although you won't have to find out who is covered by your insurance plan, it is a good idea to ask around and get recommendations. You will then need to call the office and see if the doctor you have chosen will accept you as a new patient and what types of options they offer for patients who are self-pay. Some doctors offer discounts or will set up payment plans  for their patients who do not have insurance, but you will need to ask so you aren't surprised  when you get to your appointment.  2) Contact Your Local Health Department Not all health departments have doctors that can see patients for sick visits, but many do, so it is worth a call to see if yours does. If you don't know where your local health department is, you can check in your phone book. The CDC also has a tool to help you locate your state's health department, and many state websites also have listings of all of their local health departments.  3) Find a Walk-in Clinic If your illness is not likely to be very severe or complicated, you may want to try a walk in clinic. These are popping up all over the country in pharmacies, drugstores, and shopping centers. They're usually staffed by nurse practitioners or physician assistants that have been trained to treat common illnesses and complaints. They're usually fairly quick and inexpensive. However, if you have serious medical issues or chronic medical problems, these are probably not your best option.  No Primary Care Doctor: - Call Health Connect at  [phone] - they can help you locate a primary care doctor that  accepts your insurance, provides certain services, etc. - Physician Referral Service- [phone]  Chronic Pain Problems: Organization         Address  Phone   Notes  [facility]  [phone] Patients need to be referred by their primary care doctor.   Medication Assistance: Organization         Address  Phone   Notes  [facility] [address] [place], [place] [address] [phone] --Must be a resident of [facility] -- Must have NO insurance coverage whatsoever (no Medicaid/ Medicare, etc.) -- The pt. MUST have a primary care doctor that directs their care regularly and follows them in the community   [facility]ist  [phone]   [employer]  [phone]    Agencies that provide inexpensive medical care: Organization          Address  Phone   Notes  [facility]  [phone]   [facility]    [phone]   [facility] [address] [place], [place] [address] [phone]   [facility] [address] [place], [place] [phone]   Planned Parenthood    [phone]   [facility]    [phone]   Community Health and [facility]  [address], [place] Phone:  [phone], Fax:  [phone] Hours of Operation:  9 am - 6 pm, M-F.  Also accepts Medicaid/Medicare and self-pay.  [facility]  [address], [place] Phone: [phone], Fax: [phone]. Hours of Operation:  8:30 am - 5:30 pm, M-F.  Also accepts Medicaid and self-pay.  [facility] [place] [address], [place] Phone: [phone]   [facility] [address] Natasha Bence [place], [place] [phone], Ext. [phone] Mondays & Thursdays: 7-9 AM.  First 15 patients are seen on a first come, first serve basis.    Medicaid-accepting [facility] Providers:  Organization         Address  Phone   Notes  [facility] [address], [place] [phone] Also accepts self-pay patients.  [facility] [address], [place]  [phone]   [facility] [address], [place] [phone]) [phone]   [facility] [address] [place] [address], [place] [phone]) [phone]   Renaye RakersVeita Bland [address], [place]   [phone] Only accepts [place] Access [place] patients after they have their name applied to their card.   Self-Pay (no insurance) in [place]:  Organization         Address  Phone   Notes  Sickle Cell Patients, [facility] [address], [place] [phone]   [facility] [address], [place] [phone]) [phone]    [facility] [place]  [address], [place] [phone]   [facility]/Dr. Osei-Bonsu  [address] [place] [address], [place] or [phone], [place] [phone] Phone number for both [place] and [place] locations is the same.  [facility]gent [facility] and [facility] [address], [place] [phone]   [facility] [place] [address] [place] [address], [place] or [address] [phone]   [facility] [address], [place] [phone](336) [phone], phone; [phone](336) [phone], fax Sees patients 1st and 3rd Saturday of every month.  Must not qualify for public or private insurance (i.e. Medicaid, Medicare, [place] Health Choice, Veterans' Benefits)  Household income should be no more than 200% of the poverty level The clinic cannot treat you if you are pregnant or think you are pregnant  Sexually transmitted diseases are not treated at the clinic.    Dental Care: Organization         Address  Phone  Notes  [facility] [address], [place] [phone] Accepts children up to age 50 who are enrolled in [place]icaid or [place] Health Choice; pregnant women with a Medicaid card; and children who have applied for Medicaid or [place] Health Choice, but were declined, whose parents can pay a reduced fee at time of service.  [facility] [place]  [address], [place] [phone]) [phone] Accepts children up to age 50 who are enrolled in [place] or [place] Health Choice; pregnant women with a Medicaid card; and children who have applied for Medicaid or  Health Choice, but were declined, whose parents can pay a reduced fee at time of service.  [facility]  [address], [place] [phone] Patients are seen by appointment only. Walk-ins are not accepted. [facility] will see patients 9618  years of age and older. Monday - Tuesday (8am-5pm) Most Wednesdays (8:30-5pm) $30 per visit, cash only  [facility]  [address], [facility] [phone]) [phone] Patients are seen by appointment only. Walk-ins are not accepted. [facility] will see patients 50 years of age and older. One Wednesday Evening (Monthly: Volunteer Based).  $30 per visit, cash only  [facility]  [phone] for adults; Children under age 664, call Graduate Pediatric Dentistry at [phone]. Children aged 564-14, please call [phone] to request a pediatric application.  Dental services are provided in all areas of dental care including fillings, crowns and bridges, complete and partial dentures, implants, gum treatment, root canals, and extractions. Preventive care is also provided. Treatment is provided to both adults  and children. Patients are selected via a lottery and there is often a waiting list.   [facility] [address], [place]  [phone] [email or web address]   [facility] [address], [place], [place] [phone], Ext. [phone] Second and Fourth Thursday of each month, opens at 6:30 AM; Clinic ends at 9 AM.  Patients are seen on a first-come first-served basis, and a limited number are seen during each clinic.   [facility]  [address], [place], [place] [phone]) [phone]   Eligibility Requirements You must have lived in [place], [place], or [place] counties for at least the last three months.   You cannot be eligible for state or federal sponsored National Cityhealthcare insurance, including CIGNAVeterans Administration, [place]icaid, or [employer]Medicare.   You generally cannot be eligible for healthcare insurance through your employer.    How to apply: Eligibility screenings are held every Tuesday and Wednesday afternoon from 1:00 pm until 4:00 pm. You do not need an appointment for the interview!  [facility]  [address], [place], [place] [phone]   [facility]  [phone]   [facility]  [phone]   [facility]  [phone]    Behavioral Health Resources in the Community: Intensive Outpatient Programs Organization         Address  Phone  Notes  [facility] [address], [place], [place] [phone]   [facility] [address], [place], [place] [phone]   [facility] [address], [place], [place]  [phone]   [facility] [address],  [place], [place] [phone] or [phone]   Substance Abuse Resources Organization         Address  Phone  Notes  [facility] Drug [facility]  [phone]   [facility]  [phone]   [facility]  [phone]   [facility]  [phone]   Residential & Outpatient Substance Abuse Program  [phone]   Psychological Services Organization         Address  Phone  Notes  [facility]  [phone]- [phone]   [facility]  [phone]- [phone]   [facility] [address], [place] [phone] or [phone]    Mobile Crisis Teams Organization         Address  Phone  Notes  [facility], Mobile Crisis Care Unit  [phone]   [facility]  [address] [place], [place] [phone]   Doristine LocksSharon DeEsch [address] [place] [phone]    Self-Help/Support Groups Organization         Address  Phone             Notes  [facility] [place] - variety of support groups  336- [phone] Call for more information  Narcotics Anonymous (NA), [facility] [address], [place]  2 meetings at this location   Statisticianesidential Treatment Programs Organization         Address  Phone  Notes  [facility] [address],    [place]   [phone]   [facility]  [address], [place] [phone], [place], [place] [phone]   [facility] [address], [place] [phone] Admissions: 8am-3pm M-F  [facility] [address]    [place], [place] [phone]   [facility] [address] [place], [place], [place] [phone]   [facility] [address],  [place], [place] [phone]   [facility] - Intensive Outpatient [address], [place], [place] [phone]   [facility] ([facility] [address]  [place], [place] [phone] or [phone]   [facility] ([facility]) [address] [place], [place] [phone] Accepts Medicaid  [facility] [address]  [place] [phone] Substance Abuse/Addiction Treatment   [facility] Organization         Address  Phone  Notes  [facility]  [phone]   Angie Fava, PhD [address] Ervin Knack [place], [place]   [phone] or [phone]   [facility]   [address] [place], [place] [phone]   [facility] [address] [place], [place] [phone] Insurance/Medicaid/sponsorship through [facility] [address]                                    [place], [place] [phone] Therapy/tele-psych/case  [facility] [address], [place] [phone]    Dr. Lolly Mustache  [phone]   [facility]  [employer] [facility]. 1) [address] [place] 2) [address] [place] 3)  [address], [place] [phone]  [phone]   [facility] Child Abuse Hotline [phone] or [phone] (After Hours)
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
## 2014-02-12 NOTE — ED Notes (Signed)
TTS in progress again.

## 2014-02-12 NOTE — ED Notes (Signed)
Pt very upset at dc time, states he doesn't use any cocaine and he didn't like that the MD place that as a diagnose on this visit, pt just through away all his dc papers on the floor and leave the ED yelling that he will sue the hospital.
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
## 2014-02-12 NOTE — BH Assessment (Signed)
Relayed results of assessment to Donell SievertSpencer Simon, PA per [facility] Pt does not meet inpt criteria and can be discharged with OP resources.   Discussed recommendation with Antony MaduraKelly Humes, PA-C who is in agreement with plan and would like this writer to discuss it with pt.    Discussed plan with RN, and requested to speak with pt regarding plan.   Spoke with pt about referrals. Pt voiced "You can't always get what you want, but I hope I am getting what I need." Discussed pt contacting referrals for placement to see if that may be an option. Educated him on how to request assessment and labs if needed.   Faxed referrals to [phone]  Clista BernhardtNancy Avin Upperman, [facility] Triage Specialist 02/12/2014 4:06 AM

## 2014-04-10 ENCOUNTER — Encounter (HOSPITAL_COMMUNITY): Payer: Self-pay | Admitting: Emergency Medicine

## 2014-04-10 ENCOUNTER — Emergency Department (HOSPITAL_COMMUNITY): Payer: Medicaid Other

## 2014-04-10 ENCOUNTER — Emergency Department (HOSPITAL_COMMUNITY)
Admission: EM | Admit: 2014-04-10 | Discharge: 2014-04-10 | Disposition: A | Discharge status: Home / Self Care | Payer: Medicaid Other | Attending: Emergency Medicine | Admitting: Emergency Medicine

## 2014-04-10 DIAGNOSIS — M5441 Lumbago with sciatica, right side: Secondary | ICD-10-CM

## 2014-04-10 DIAGNOSIS — W1839XA Other fall on same level, initial encounter: Secondary | ICD-10-CM | POA: Diagnosis not present

## 2014-04-10 DIAGNOSIS — Z79899 Other long term (current) drug therapy: Secondary | ICD-10-CM | POA: Insufficient documentation

## 2014-04-10 DIAGNOSIS — Z8719 Personal history of other diseases of the digestive system: Secondary | ICD-10-CM | POA: Diagnosis not present

## 2014-04-10 DIAGNOSIS — W19XXXA Unspecified fall, initial encounter: Secondary | ICD-10-CM

## 2014-04-10 DIAGNOSIS — G43909 Migraine, unspecified, not intractable, without status migrainosus: Secondary | ICD-10-CM | POA: Insufficient documentation

## 2014-04-10 DIAGNOSIS — F329 Major depressive disorder, single episode, unspecified: Secondary | ICD-10-CM | POA: Insufficient documentation

## 2014-04-10 DIAGNOSIS — M5442 Lumbago with sciatica, left side: Secondary | ICD-10-CM

## 2014-04-10 DIAGNOSIS — F419 Anxiety disorder, unspecified: Secondary | ICD-10-CM | POA: Insufficient documentation

## 2014-04-10 DIAGNOSIS — S199XXA Unspecified injury of neck, initial encounter: Secondary | ICD-10-CM | POA: Insufficient documentation

## 2014-04-10 DIAGNOSIS — Y998 Other external cause status: Secondary | ICD-10-CM | POA: Insufficient documentation

## 2014-04-10 DIAGNOSIS — J40 Bronchitis, not specified as acute or chronic: Secondary | ICD-10-CM | POA: Insufficient documentation

## 2014-04-10 DIAGNOSIS — Y9289 Other specified places as the place of occurrence of the external cause: Secondary | ICD-10-CM | POA: Insufficient documentation

## 2014-04-10 DIAGNOSIS — J441 Chronic obstructive pulmonary disease with (acute) exacerbation: Secondary | ICD-10-CM | POA: Diagnosis not present

## 2014-04-10 DIAGNOSIS — G8929 Other chronic pain: Secondary | ICD-10-CM | POA: Insufficient documentation

## 2014-04-10 DIAGNOSIS — I10 Essential (primary) hypertension: Secondary | ICD-10-CM | POA: Diagnosis not present

## 2014-04-10 DIAGNOSIS — R0602 Shortness of breath: Secondary | ICD-10-CM

## 2014-04-10 DIAGNOSIS — I252 Old myocardial infarction: Secondary | ICD-10-CM | POA: Diagnosis not present

## 2014-04-10 DIAGNOSIS — Z720 Tobacco use: Secondary | ICD-10-CM | POA: Diagnosis not present

## 2014-04-10 DIAGNOSIS — Y9389 Activity, other specified: Secondary | ICD-10-CM | POA: Insufficient documentation

## 2014-04-10 DIAGNOSIS — Z7982 Long term (current) use of aspirin: Secondary | ICD-10-CM | POA: Insufficient documentation

## 2014-04-10 DIAGNOSIS — S3992XA Unspecified injury of lower back, initial encounter: Secondary | ICD-10-CM | POA: Insufficient documentation

## 2014-04-10 DIAGNOSIS — M1990 Unspecified osteoarthritis, unspecified site: Secondary | ICD-10-CM | POA: Insufficient documentation

## 2014-04-10 DIAGNOSIS — Z7951 Long term (current) use of inhaled steroids: Secondary | ICD-10-CM | POA: Diagnosis not present

## 2014-04-10 MED ORDER — ALBUTEROL SULFATE (2.5 MG/3ML) 0.083% IN NEBU: 2.5000 mg | INHALATION_SOLUTION | RESPIRATORY_TRACT | Status: DC | PRN

## 2014-04-10 MED ORDER — HYDROMORPHONE HCL 1 MG/ML IJ SOLN
1.0000 mg | Freq: Once | INTRAMUSCULAR | Status: AC
  Administered 2014-04-10: 1 mg via INTRAMUSCULAR
  Filled 2014-04-10: qty 1

## 2014-04-10 MED ORDER — ONDANSETRON 4 MG PO TBDP
4.0000 mg | ORAL_TABLET | Freq: Once | ORAL | Status: AC
  Administered 2014-04-10: 4 mg via ORAL
  Filled 2014-04-10: qty 1
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
## 2014-04-10 NOTE — ED Notes (Signed)
Pt reports improvement in pain post medication administration reporting 6/10 left hip pain and 5/10 neck and lower back pain.

## 2014-04-10 NOTE — ED Notes (Signed)
Upon further assessment pt reports chronic pain to neck and lower back never above a 5/10 but currently reports 6/10. Pt also complaint of left hip pain since fall contrary to previous report.
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
## 2014-04-10 NOTE — Discharge Instructions (Signed)
Back Pain, Adult Back pain is very common. The pain often gets better over time. The cause of back pain is usually not dangerous. Most people can learn to manage their back pain on their own.  HOME CARE   Stay active. Start with short walks on flat ground if you can. Try to walk farther each day.  Do not sit, drive, or stand in one place for more than 30 minutes. Do not stay in bed.  Do not avoid exercise or work. Activity can help your back heal faster.  Be careful when you bend or lift an object. Bend at your knees, keep the object close to you, and do not twist.  Sleep on a firm mattress. Lie on your side, and bend your knees. If you lie on your back, put a pillow under your knees.  Only take medicines as told by your doctor.  Put ice on the injured area.  Put ice in a plastic bag.  Place a towel between your skin and the bag.  Leave the ice on for 15-20 minutes, 03-04 times a day for the first 2 to 3 days. After that, you can switch between ice and heat packs.  Ask your doctor about back exercises or massage.  Avoid feeling anxious or stressed. Find good ways to deal with stress, such as exercise. GET HELP RIGHT AWAY IF:   Your pain does not go away with rest or medicine.  Your pain does not go away in 1 week.  You have new problems.  You do not feel well.  The pain spreads into your legs.  You cannot control when you poop (bowel movement) or pee (urinate).  Your arms or legs feel weak or lose feeling (numbness).  You feel sick to your stomach (nauseous) or throw up (vomit).  You have belly (abdominal) pain.  You feel like you may pass out (faint). MAKE SURE YOU:   Understand these instructions.  Will watch your condition.  Will get help right away if you are not doing well or get worse. Document Released: 08/31/2007 Document Revised: 06/06/2011 Document Reviewed: 07/16/2013 [facility] Patient Information 2015 [place], [place]. This information is not intended  to replace advice given to you by your health care provider. Make sure you discuss any questions you have with your health care provider.  Cough, Adult  A cough is a reflex. It helps you clear your throat and airways. A cough can help heal your body. A cough can last 2 or 3 weeks (acute) or may last more than 8 weeks (chronic). Some common causes of a cough can include an infection, allergy, or a cold. HOME CARE  Only take medicine as told by your doctor.  If given, take your medicines (antibiotics) as told. Finish them even if you start to feel better.  Use a cold steam vaporizer or humidifier in your home. This can help loosen thick spit (secretions).  Sleep so you are almost sitting up (semi-upright). Use pillows to do this. This helps reduce coughing.  Rest as needed.  Stop smoking if you smoke. GET HELP RIGHT AWAY IF:  You have yellowish-white fluid (pus) in your thick spit.  Your cough gets worse.  Your medicine does not reduce coughing, and you are losing sleep.  You cough up blood.  You have trouble breathing.  Your pain gets worse and medicine does not help.  You have a fever. MAKE SURE YOU:   Understand these instructions.  Will watch your condition.  Will  get help right away if you are not doing well or get worse. Document Released: 11/25/2010 Document Revised: 07/29/2013 Document Reviewed: 11/25/2010 [facility] Patient Information 2015 [place], [place]. This information is not intended to replace advice given to you by your health care provider. Make sure you discuss any questions you have with your health care provider.
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
## 2014-06-05 ENCOUNTER — Encounter (HOSPITAL_COMMUNITY): Payer: Self-pay | Admitting: *Deleted

## 2014-06-05 ENCOUNTER — Emergency Department (HOSPITAL_COMMUNITY): Payer: Medicaid Other

## 2014-06-05 ENCOUNTER — Emergency Department (HOSPITAL_COMMUNITY)
Admission: EM | Admit: 2014-06-05 | Discharge: 2014-06-05 | Disposition: A | Discharge status: Home / Self Care | Payer: Medicaid Other | Attending: Emergency Medicine | Admitting: Emergency Medicine

## 2014-06-05 DIAGNOSIS — Y9289 Other specified places as the place of occurrence of the external cause: Secondary | ICD-10-CM | POA: Diagnosis not present

## 2014-06-05 DIAGNOSIS — F329 Major depressive disorder, single episode, unspecified: Secondary | ICD-10-CM | POA: Diagnosis not present

## 2014-06-05 DIAGNOSIS — Z7951 Long term (current) use of inhaled steroids: Secondary | ICD-10-CM | POA: Diagnosis not present

## 2014-06-05 DIAGNOSIS — S9781XA Crushing injury of right foot, initial encounter: Secondary | ICD-10-CM | POA: Insufficient documentation

## 2014-06-05 DIAGNOSIS — Z720 Tobacco use: Secondary | ICD-10-CM | POA: Insufficient documentation

## 2014-06-05 DIAGNOSIS — S9780XA Crushing injury of unspecified foot, initial encounter: Secondary | ICD-10-CM

## 2014-06-05 DIAGNOSIS — M1990 Unspecified osteoarthritis, unspecified site: Secondary | ICD-10-CM | POA: Diagnosis not present

## 2014-06-05 DIAGNOSIS — Z8719 Personal history of other diseases of the digestive system: Secondary | ICD-10-CM | POA: Insufficient documentation

## 2014-06-05 DIAGNOSIS — Z9861 Coronary angioplasty status: Secondary | ICD-10-CM | POA: Insufficient documentation

## 2014-06-05 DIAGNOSIS — F419 Anxiety disorder, unspecified: Secondary | ICD-10-CM | POA: Diagnosis not present

## 2014-06-05 DIAGNOSIS — Y998 Other external cause status: Secondary | ICD-10-CM | POA: Diagnosis not present

## 2014-06-05 DIAGNOSIS — Y9389 Activity, other specified: Secondary | ICD-10-CM | POA: Insufficient documentation

## 2014-06-05 DIAGNOSIS — I10 Essential (primary) hypertension: Secondary | ICD-10-CM | POA: Insufficient documentation

## 2014-06-05 DIAGNOSIS — Z7982 Long term (current) use of aspirin: Secondary | ICD-10-CM | POA: Diagnosis not present

## 2014-06-05 DIAGNOSIS — X58XXXA Exposure to other specified factors, initial encounter: Secondary | ICD-10-CM | POA: Diagnosis not present

## 2014-06-05 DIAGNOSIS — Z79899 Other long term (current) drug therapy: Secondary | ICD-10-CM | POA: Diagnosis not present

## 2014-06-05 DIAGNOSIS — G8929 Other chronic pain: Secondary | ICD-10-CM | POA: Insufficient documentation

## 2014-06-05 DIAGNOSIS — I252 Old myocardial infarction: Secondary | ICD-10-CM | POA: Diagnosis not present

## 2014-06-05 DIAGNOSIS — J449 Chronic obstructive pulmonary disease, unspecified: Secondary | ICD-10-CM | POA: Diagnosis not present

## 2014-06-05 DIAGNOSIS — S9782XA Crushing injury of left foot, initial encounter: Secondary | ICD-10-CM | POA: Diagnosis not present

## 2014-06-05 DIAGNOSIS — Z9889 Other specified postprocedural states: Secondary | ICD-10-CM | POA: Insufficient documentation

## 2014-06-05 MED ORDER — ONDANSETRON HCL 4 MG/2ML IJ SOLN
4.0000 mg | Freq: Once | INTRAMUSCULAR | Status: AC
  Administered 2014-06-05: 4 mg via INTRAVENOUS
  Filled 2014-06-05: qty 2

## 2014-06-05 MED ORDER — HYDROMORPHONE HCL 1 MG/ML IJ SOLN
1.0000 mg | Freq: Once | INTRAMUSCULAR | Status: AC
  Administered 2014-06-05: 1 mg via INTRAVENOUS
  Filled 2014-06-05: qty 1

## 2014-06-05 MED ORDER — OXYCODONE-ACETAMINOPHEN 5-325 MG PO TABS: 1.0000 | ORAL_TABLET | Freq: Four times a day (QID) | ORAL | Status: DC | PRN

## 2014-06-05 MED ORDER — NAPROXEN 375 MG PO TABS: 375.0000 mg | ORAL_TABLET | Freq: Two times a day (BID) | ORAL | Status: DC

## 2014-06-05 MED ORDER — HYDROMORPHONE HCL 1 MG/ML IJ SOLN
0.5000 mg | Freq: Once | INTRAMUSCULAR | Status: AC
  Administered 2014-06-05: 0.5 mg via INTRAVENOUS
  Filled 2014-06-05: qty 1

## 2014-06-05 MED ORDER — HYDROMORPHONE HCL 1 MG/ML IJ SOLN
1.0000 mg | Freq: Once | INTRAMUSCULAR | Status: DC
Start: 2014-06-05 — End: 2014-06-05

## 2014-06-05 MED ORDER — CYCLOBENZAPRINE HCL 10 MG PO TABS: 10.0000 mg | ORAL_TABLET | Freq: Two times a day (BID) | ORAL | Status: DC | PRN

## 2014-06-05 NOTE — Discharge Instructions (Signed)
Crush Injury, Fingers or Toes A crush injury to the fingers or toes means the tissues have been damaged by being squeezed (compressed). There will be bleeding into the tissues and swelling. Often, blood will collect under the skin. When this happens, the skin on the finger often dies and may slough off (shed) 1 week to 10 days later. Usually, new skin is growing underneath. If the injury has been too severe and the tissue does not survive, the damaged tissue may begin to turn black over several days.  Wounds which occur because of the crushing may be stitched (sutured) shut. However, crush injuries are more likely to become infected than other injuries.These wounds may not be closed as tightly as other types of cuts to prevent infection. Nails involved are often lost. These usually grow back over several weeks.  DIAGNOSIS X-rays may be taken to see if there is any injury to the bones. TREATMENT Broken bones (fractures) may be treated with splinting, depending on the fracture. Often, no treatment is required for fractures of the last bone in the fingers or toes. HOME CARE INSTRUCTIONS   The crushed part should be raised (elevated) above the heart or center of the chest as much as possible for the first several days or as directed. This helps with pain and lessens swelling. Less swelling increases the chances that the crushed part will survive.  Put ice on the injured area.  Put ice in a plastic bag.  Place a towel between your skin and the bag.  Leave the ice on for 15-20 minutes, 03-04 times a day for the first 2 days.  Only take over-the-counter or prescription medicines for pain, discomfort, or fever as directed by your caregiver.  Use your injured part only as directed.  Change your bandages (dressings) as directed.  Keep all follow-up appointments as directed by your caregiver. Not keeping your appointment could result in a chronic or permanent injury, pain, and disability. If there is  any problem keeping the appointment, you must call to reschedule. SEEK IMMEDIATE MEDICAL CARE IF:   There is redness, swelling, or increasing pain in the wound area.  Pus is coming from the wound.  You have a fever.  You notice a bad smell coming from the wound or dressing.  The edges of the wound do not stay together after the sutures have been removed.  You are unable to move the injured finger or toe. MAKE SURE YOU:   Understand these instructions.  Will watch your condition.  Will get help right away if you are not doing well or get worse. Document Released: 03/14/2005 Document Revised: 06/06/2011 Document Reviewed: 07/30/2010 [facility] Patient Information 2015 [place], [place]. This information is not intended to replace advice given to you by your health care provider. Make sure you discuss any questions you have with your health care provider. RICE: Routine Care for Injuries The routine care of many injuries includes Rest, Ice, Compression, and Elevation (RICE). HOME CARE INSTRUCTIONS  Rest is needed to allow your body to heal. Routine activities can usually be resumed when comfortable. Injured tendons and bones can take up to 6 weeks to heal. Tendons are the cord-like structures that attach muscle to bone.  Ice following an injury helps keep the swelling down and reduces pain.  Put ice in a plastic bag.  Place a towel between your skin and the bag.  Leave the ice on for 15-20 minutes, 3-4 times a day, or as directed by your health care  provider. Do this while awake, for the first 24 to 48 hours. After that, continue as directed by your caregiver.  Compression helps keep swelling down. It also gives support and helps with discomfort. If an elastic bandage has been applied, it should be removed and reapplied every 3 to 4 hours. It should not be applied tightly, but firmly enough to keep swelling down. Watch fingers or toes for swelling, bluish discoloration, coldness,  numbness, or excessive pain. If any of these problems occur, remove the bandage and reapply loosely. Contact your caregiver if these problems continue.  Elevation helps reduce swelling and decreases pain. With extremities, such as the arms, hands, legs, and feet, the injured area should be placed near or above the level of the heart, if possible. SEEK IMMEDIATE MEDICAL CARE IF:  You have persistent pain and swelling.  You develop redness, numbness, or unexpected weakness.  Your symptoms are getting worse rather than improving after several days. These symptoms may indicate that further evaluation or further X-rays are needed. Sometimes, X-rays may not show a small broken bone (fracture) until 1 week or 10 days later. Make a follow-up appointment with your caregiver. Ask when your X-ray results will be ready. Make sure you get your X-ray results. Document Released: 06/26/2000 Document Revised: 03/19/2013 Document Reviewed: 08/13/2010 [facility] Patient Information 2015 [place], [place]. This information is not intended to replace advice given to you by your health care provider. Make sure you discuss any questions you have with your health care provider.
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
## 2014-06-05 NOTE — ED Notes (Signed)
Ortho called 
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
## 2014-06-05 NOTE — Progress Notes (Signed)
Orthopedic Tech Progress Note Patient Details:  Vincent Dennis Jan 03, 1966 [id number]  Ortho Devices Type of Ortho Device: Crutches, Postop shoe/boot Ortho Device/Splint Location: LLE Ortho Device/Splint Interventions: Ordered, Application   Vincent Dennis, Vincent Dennis Craig 06/05/2014, 9:40 PM

## 2014-06-05 NOTE — ED Notes (Signed)
Pt verbalized understanding of d/c instructions and has no further questions.  

## 2014-07-16 ENCOUNTER — Encounter (HOSPITAL_COMMUNITY): Payer: Self-pay

## 2014-07-16 ENCOUNTER — Emergency Department (HOSPITAL_COMMUNITY)
Admission: EM | Admit: 2014-07-16 | Discharge: 2014-07-16 | Disposition: A | Discharge status: Home / Self Care | Payer: Medicaid Other | Attending: Emergency Medicine | Admitting: Emergency Medicine

## 2014-07-16 DIAGNOSIS — Z8719 Personal history of other diseases of the digestive system: Secondary | ICD-10-CM | POA: Insufficient documentation

## 2014-07-16 DIAGNOSIS — F419 Anxiety disorder, unspecified: Secondary | ICD-10-CM | POA: Insufficient documentation

## 2014-07-16 DIAGNOSIS — Z720 Tobacco use: Secondary | ICD-10-CM | POA: Diagnosis not present

## 2014-07-16 DIAGNOSIS — Z7982 Long term (current) use of aspirin: Secondary | ICD-10-CM | POA: Insufficient documentation

## 2014-07-16 DIAGNOSIS — J449 Chronic obstructive pulmonary disease, unspecified: Secondary | ICD-10-CM | POA: Diagnosis not present

## 2014-07-16 DIAGNOSIS — K029 Dental caries, unspecified: Secondary | ICD-10-CM

## 2014-07-16 DIAGNOSIS — Z79899 Other long term (current) drug therapy: Secondary | ICD-10-CM | POA: Insufficient documentation

## 2014-07-16 DIAGNOSIS — G43909 Migraine, unspecified, not intractable, without status migrainosus: Secondary | ICD-10-CM | POA: Diagnosis not present

## 2014-07-16 DIAGNOSIS — M1990 Unspecified osteoarthritis, unspecified site: Secondary | ICD-10-CM | POA: Diagnosis not present

## 2014-07-16 DIAGNOSIS — H9209 Otalgia, unspecified ear: Secondary | ICD-10-CM | POA: Insufficient documentation

## 2014-07-16 DIAGNOSIS — F329 Major depressive disorder, single episode, unspecified: Secondary | ICD-10-CM | POA: Insufficient documentation

## 2014-07-16 DIAGNOSIS — G8929 Other chronic pain: Secondary | ICD-10-CM | POA: Diagnosis not present

## 2014-07-16 DIAGNOSIS — I10 Essential (primary) hypertension: Secondary | ICD-10-CM | POA: Insufficient documentation

## 2014-07-16 DIAGNOSIS — I252 Old myocardial infarction: Secondary | ICD-10-CM | POA: Insufficient documentation

## 2014-07-16 DIAGNOSIS — K088 Other specified disorders of teeth and supporting structures: Secondary | ICD-10-CM | POA: Diagnosis present

## 2014-07-16 MED ORDER — BUPIVACAINE-EPINEPHRINE (PF) 0.5% -1:200000 IJ SOLN
1.8000 mL | Freq: Once | INTRAMUSCULAR | Status: AC
  Administered 2014-07-16: 1.8 mL
  Filled 2014-07-16: qty 1.8

## 2014-07-16 MED ORDER — PENICILLIN V POTASSIUM 500 MG PO TABS: 500.0000 mg | ORAL_TABLET | Freq: Three times a day (TID) | ORAL | Status: DC

## 2014-07-16 NOTE — ED Provider Notes (Signed)
CSN: [id number]     Arrival date & time 07/16/14  1401 History  This chart is scribed for non-physician practitioner, Dierdre Forth, PA-C, working with Doug Sou, MD by Abel Presto, ED Scribe.  This patient was seen in room TR06C/TR06C and the patient's care was started 2:44 PM.      No chief complaint on file.   The history is provided by the patient and medical records. No language interpreter was used.   HPI Comments: Vincent Dennis is a 49 y.o. male PMHx of MI, HTN, COPD, recurrent URI, mental disorder, seizures, migraines who presents to the Emergency Department complaining of 10/10 left lower dental pain with onset 3-4 days ago. Pt states he lost a crown to same area several weeks ago. Pt notes associated ear pain and chills. Pt has an appointment with a dentist for 07/23/14. Pt takes hydrocodone for chronic pain with no relief. He reports he tried placing a moist hydrocodone pill onto the broken tooth with no relief. Pt also takes Xanax and Aggrenox. He states he has not been compliant with his Aggrenox. Pt has NKDA. Pt denies fever, nausea, vomiting, and facial swelling.    Past Medical History  Diagnosis Date  . Chronic back pain   . Myocardial infarction   . Angina   . Hypertension   . COPD (chronic obstructive pulmonary disease)   . Shortness of breath   . Recurrent upper respiratory infection (URI)   . GERD (gastroesophageal reflux disease)   . H/O hiatal hernia   . Headache(784.0)   . Arthritis   . Mental disorder   . Anxiety   . Depression   . Seizures   . Migraines    Past Surgical History  Procedure Laterality Date  . Cardiac catheterization    . Hernia repair    . Back surgery    . Coronary angioplasty     History reviewed. No pertinent family history. History  Substance Use Topics  . Smoking status: Current Every Day Smoker -- 1.00 packs/day for 31 years  . Smokeless tobacco: Not on file  . Alcohol Use: No    Review of Systems   Constitutional: Positive for chills. Negative for fever and appetite change.  HENT: Positive for dental problem and ear pain. Negative for drooling, facial swelling, nosebleeds, postnasal drip, rhinorrhea and trouble swallowing.   Eyes: Negative for pain and redness.  Respiratory: Negative for cough and wheezing.   Cardiovascular: Negative for chest pain.  Gastrointestinal: Negative for nausea, vomiting and abdominal pain.  Musculoskeletal: Negative for neck pain and neck stiffness.  Skin: Negative for color change and rash.  Neurological: Negative for weakness, light-headedness and headaches.  All other systems reviewed and are negative.   Allergies  Review of patient's allergies indicates no known allergies.  Home Medications   Prior to Admission medications   Medication Sig Start Date End Date Taking? Authorizing Provider  ALPRAZolam Prudy Feeler) 1 MG tablet Take 1 tablet (1 mg total) by mouth 3 (three) times daily as needed for anxiety. 10/02/13   Junious Silk, PA-C  aspirin EC 81 MG tablet Take 81 mg by mouth daily.    Historical Provider, MD  Aspirin-Acetaminophen-Caffeine (GOODYS EXTRA STRENGTH PO) Take 2 packets by mouth 2 (two) times daily as needed (pain).    Historical Provider, MD  budesonide-formoterol (SYMBICORT) 160-4.5 MCG/ACT inhaler Inhale 2 puffs into the lungs 2 (two) times daily as needed (shortness of breath).     Historical Provider, MD  cyclobenzaprine (FLEXERIL) 10  MG tablet Take 1 tablet (10 mg total) by mouth 2 (two) times daily as needed for muscle spasms. 06/05/14   Tiffany Neva Seat, PA-C  HYDROcodone-acetaminophen (NORCO) 10-325 MG per tablet Take 1 tablet by mouth 3 (three) times daily.    Historical Provider, MD  HYDROcodone-acetaminophen (NORCO/VICODIN) 5-325 MG per tablet Take 1-2 tablets by mouth every 6 (six) hours as needed for moderate pain or severe pain. Patient not taking: Reported on 04/10/2014 10/02/13   Junious Silk, PA-C  naproxen (NAPROSYN) 375 MG  tablet Take 1 tablet (375 mg total) by mouth 2 (two) times daily. 06/05/14   Marlon Pel, PA-C  oxyCODONE-acetaminophen (PERCOCET/ROXICET) 5-325 MG per tablet Take 1 tablet by mouth every 6 (six) hours as needed for severe pain. 06/05/14   Marlon Pel, PA-C  penicillin v potassium (VEETID) 500 MG tablet Take 1 tablet (500 mg total) by mouth 3 (three) times daily. 07/16/14   Cornelious Bartolucci, PA-C  SUMAtriptan (IMITREX) 6 MG/0.5ML SOLN injection Inject 6 mg into the skin every 2 (two) hours as needed for migraine or headache. May repeat in 2 hours if headache persists or recurs.    Historical Provider, MD  traMADol (ULTRAM) 50 MG tablet Take 25-50 mg by mouth every 6 (six) hours as needed for moderate pain.     Historical Provider, MD   BP 126/85 mmHg  Pulse 76  Temp(Src) 98.3 F (36.8 C) (Oral)  Resp 18  Ht  (1.88 m)  Wt 220 lb (99.791 kg)  BMI 28.23 kg/m2  SpO2 98% Physical Exam  Constitutional: He appears well-developed and well-nourished.  HENT:  Head: Normocephalic.  Right Ear: Tympanic membrane, external ear and ear canal normal.  Left Ear: Tympanic membrane, external ear and ear canal normal.  Nose: Nose normal. Right sinus exhibits no maxillary sinus tenderness and no frontal sinus tenderness. Left sinus exhibits no maxillary sinus tenderness and no frontal sinus tenderness.  Mouth/Throat: Uvula is midline, oropharynx is clear and moist and mucous membranes are normal. No oral lesions. Abnormal dentition. Dental caries present. No uvula swelling or lacerations. No oropharyngeal exudate, posterior oropharyngeal edema, posterior oropharyngeal erythema or tonsillar abscesses.  No gingival swelling, fluctuance or induration No gross abscess multiple dental caries and broken teeth throughout; Pt is most tender over tooth #18 which is broken; no woody induration to the floor of the mouth  Eyes: Conjunctivae are normal. Pupils are equal, round, and reactive to light. Right eye  exhibits no discharge. Left eye exhibits no discharge.  Neck: Normal range of motion. Neck supple.  No stridor Handling secretions without difficulty No nuchal rigidity No cervical lymphadenopathy   Cardiovascular: Normal rate, regular rhythm and normal heart sounds.   Pulmonary/Chest: Effort normal. No respiratory distress.  Equal chest rise  Abdominal: Soft. Bowel sounds are normal. He exhibits no distension. There is no tenderness.  Lymphadenopathy:    He has no cervical adenopathy.  Neurological: He is alert.  Skin: Skin is warm and dry.  Psychiatric: He has a normal mood and affect.  Nursing note and vitals reviewed.   ED Course  Procedures (including critical care time). DIAGNOSTIC STUDIES: Oxygen Saturation is 98% on room air, normal by my interpretation.    COORDINATION OF CARE: 2:48 PM Discussed treatment plan with patient at beside dental block and suggested continuation of hydrocodone use, the patient agrees with the plan and has no further questions at this time.   Labs Review Labs Reviewed - No data to display  Imaging Review No results  found.   EKG Interpretation None      MDM   Final diagnoses:  Pain due to dental caries   Andres ShadFrank B Ditommaso presents with recurrent dental pain.  Patient with toothache.  No gross abscess.  Exam unconcerning for Ludwig's angina or spread of infection.  Will treat with penicillin.  Pt with chronic pain and vicodin Rx at home.  Urged patient to follow-up with dentist.    BP 126/85 mmHg  Pulse 76  Temp(Src) 98.3 F (36.8 C) (Oral)  Resp 18  Ht 6\' 2"  (1.88 m)  Wt 220 lb (99.791 kg)  BMI 28.23 kg/m2  SpO2 98%  I personally performed the services described in this documentation, which was scribed in my presence. The recorded information has been reviewed and is accurate.   Dahlia ClientHannah Cote Mayabb, PA-C 07/16/14 1511  Doug SouSam Jacubowitz, MD 07/16/14 [id number]

## 2014-07-16 NOTE — Discharge Instructions (Signed)
1. Medications: penicillin, usual home medications 2. Treatment: rest, drink plenty of fluids, take medications as prescribed 3. Follow Up: Please followup with dentistry within 1 week for discussion of your diagnoses and further evaluation after today's visit; if you do not have a primary care doctor use the resource guide provided to find one; Return to the ER for high fevers, difficulty breathing, difficulty swallowing or other concerning symptoms    Dental Pain A tooth ache may be caused by cavities (tooth decay). Cavities expose the nerve of the tooth to air and hot or cold temperatures. It may come from an infection or abscess (also called a boil or furuncle) around your tooth. It is also often caused by dental caries (tooth decay). This causes the pain you are having. DIAGNOSIS  Your caregiver can diagnose this problem by exam. TREATMENT   If caused by an infection, it may be treated with medications which kill germs (antibiotics) and pain medications as prescribed by your caregiver. Take medications as directed.  Only take over-the-counter or prescription medicines for pain, discomfort, or fever as directed by your caregiver.  Whether the tooth ache today is caused by infection or dental disease, you should see your dentist as soon as possible for further care. SEEK MEDICAL CARE IF: The exam and treatment you received today has been provided on an emergency basis only. This is not a substitute for complete medical or dental care. If your problem worsens or new problems (symptoms) appear, and you are unable to meet with your dentist, call or return to this location. SEEK IMMEDIATE MEDICAL CARE IF:   You have a fever.  You develop redness and swelling of your face, jaw, or neck.  You are unable to open your mouth.  You have severe pain uncontrolled by pain medicine. MAKE SURE YOU:   Understand these instructions.  Will watch your condition.  Will get help right away if you are  not doing well or get worse. Document Released: 03/14/2005 Document Revised: 06/06/2011 Document Reviewed: 10/31/2007 [facility] Patient Information 2015 [employer], [place]. This information is not intended to replace advice given to you by your health care provider. Make sure you discuss any questions you have with your health care provider.

## 2014-07-16 NOTE — ED Notes (Signed)
Pt presents with L lower tooth pain, reports crown came off x 1 month ago but no pain until 3-4 days ago.  Pt reports pain "to all my teeth" now, reports pain is unbearable.

## 2014-09-08 ENCOUNTER — Emergency Department
Admission: EM | Admit: 2014-09-08 | Discharge: 2014-09-08 | Disposition: A | Discharge status: Home / Self Care | Payer: Medicaid Other | Attending: Emergency Medicine | Admitting: Emergency Medicine

## 2014-09-08 ENCOUNTER — Encounter: Payer: Self-pay | Admitting: Emergency Medicine

## 2014-09-08 DIAGNOSIS — Z79899 Other long term (current) drug therapy: Secondary | ICD-10-CM | POA: Insufficient documentation

## 2014-09-08 DIAGNOSIS — I10 Essential (primary) hypertension: Secondary | ICD-10-CM | POA: Insufficient documentation

## 2014-09-08 DIAGNOSIS — Z7982 Long term (current) use of aspirin: Secondary | ICD-10-CM | POA: Insufficient documentation

## 2014-09-08 DIAGNOSIS — Z720 Tobacco use: Secondary | ICD-10-CM | POA: Diagnosis not present

## 2014-09-08 DIAGNOSIS — F1129 Opioid dependence with unspecified opioid-induced disorder: Secondary | ICD-10-CM | POA: Insufficient documentation

## 2014-09-08 DIAGNOSIS — Z792 Long term (current) use of antibiotics: Secondary | ICD-10-CM | POA: Diagnosis not present

## 2014-09-08 DIAGNOSIS — F1210 Cannabis abuse, uncomplicated: Secondary | ICD-10-CM | POA: Diagnosis not present

## 2014-09-08 DIAGNOSIS — F1310 Sedative, hypnotic or anxiolytic abuse, uncomplicated: Secondary | ICD-10-CM | POA: Insufficient documentation

## 2014-09-08 DIAGNOSIS — F1110 Opioid abuse, uncomplicated: Secondary | ICD-10-CM | POA: Diagnosis present

## 2014-09-08 HISTORY — DX: Transient cerebral ischemic attack, unspecified: G45.9

## 2014-09-08 LAB — COMPREHENSIVE METABOLIC PANEL
ALT: 16 U/L — ABNORMAL LOW (ref 17–63)
AST: 21 U/L (ref 15–41)
Albumin: 3.8 g/dL (ref 3.5–5.0)
Alkaline Phosphatase: 58 U/L (ref 38–126)
Anion gap: 5 (ref 5–15)
BUN: 18 mg/dL (ref 6–20)
CALCIUM: 8.7 mg/dL — AB (ref 8.9–10.3)
CO2: 27 mmol/L (ref 22–32)
CREATININE: 0.95 mg/dL (ref 0.61–1.24)
Chloride: 108 mmol/L (ref 101–111)
GFR calc non Af Amer: >60 mL/min (ref >60)
Glucose, Bld: 96 mg/dL (ref 65–99)
Potassium: 4.1 mmol/L (ref 3.5–5.1)
SODIUM: 140 mmol/L (ref 135–145)
TOTAL PROTEIN: 6.6 g/dL (ref 6.5–8.1)
Total Bilirubin: 0.4 mg/dL (ref 0.3–1.2)

## 2014-09-08 LAB — URINALYSIS COMPLETE WITH MICROSCOPIC (ARMC ONLY)
BACTERIA UA: NONE SEEN
Bilirubin Urine: NEGATIVE
Glucose, UA: NEGATIVE mg/dL
Hgb urine dipstick: NEGATIVE
Ketones, ur: NEGATIVE mg/dL
LEUKOCYTES UA: NEGATIVE
Nitrite: NEGATIVE
PH: 7 (ref 5.0–8.0)
PROTEIN: NEGATIVE mg/dL
SPECIFIC GRAVITY, URINE: 1.023 (ref 1.005–1.030)
SQUAMOUS EPITHELIAL / LPF: NONE SEEN

## 2014-09-08 LAB — CBC
HCT: 43.3 % (ref 40.0–52.0)
Hemoglobin: 14.2 g/dL (ref 13.0–18.0)
MCH: 29 pg (ref 26.0–34.0)
MCHC: 32.8 g/dL (ref 32.0–36.0)
MCV: 88.4 fL (ref 80.0–100.0)
PLATELETS: 200 10*3/uL (ref 150–440)
RBC: 4.89 MIL/uL (ref 4.40–5.90)
RDW: 13.5 % (ref 11.5–14.5)
WBC: 11.2 10*3/uL — ABNORMAL HIGH (ref 3.8–10.6)

## 2014-09-08 LAB — ACETAMINOPHEN LEVEL: Acetaminophen (Tylenol), Serum: <10 ug/mL — ABNORMAL LOW (ref 10–30)

## 2014-09-08 LAB — URINE DRUG SCREEN, QUALITATIVE (ARMC ONLY)
AMPHETAMINES, UR SCREEN: NOT DETECTED
Barbiturates, Ur Screen: NOT DETECTED
Benzodiazepine, Ur Scrn: POSITIVE — AB
CANNABINOID 50 NG, UR ~~LOC~~: POSITIVE — AB
COCAINE METABOLITE, UR ~~LOC~~: NOT DETECTED
MDMA (ECSTASY) UR SCREEN: NOT DETECTED
Methadone Scn, Ur: NOT DETECTED
Opiate, Ur Screen: NOT DETECTED
Phencyclidine (PCP) Ur S: NOT DETECTED
Tricyclic, Ur Screen: NOT DETECTED

## 2014-09-08 LAB — SALICYLATE LEVEL: Salicylate Lvl: <4 mg/dL (ref 2.8–30.0)

## 2014-09-08 LAB — ETHANOL: Alcohol, Ethyl (B): <5 mg/dL (ref <5)

## 2014-09-08 MED ORDER — NICOTINE 21 MG/24HR TD PT24
21.0000 mg | MEDICATED_PATCH | Freq: Once | TRANSDERMAL | Status: DC
  Administered 2014-09-08: 21 mg via TRANSDERMAL

## 2014-09-08 MED ORDER — ALPRAZOLAM 0.5 MG PO TABS
ORAL_TABLET | ORAL | Status: AC
  Administered 2014-09-08: 1 mg via ORAL
  Filled 2014-09-08: qty 2

## 2014-09-08 MED ORDER — ALPRAZOLAM 0.5 MG PO TABS
1.0000 mg | ORAL_TABLET | Freq: Two times a day (BID) | ORAL | Status: DC | PRN
  Administered 2014-09-08: 1 mg via ORAL

## 2014-09-08 MED ORDER — NICOTINE 21 MG/24HR TD PT24
MEDICATED_PATCH | TRANSDERMAL | Status: AC
  Administered 2014-09-08: 21 mg via TRANSDERMAL
  Filled 2014-09-08: qty 1

## 2014-09-08 MED ORDER — CLONIDINE HCL 0.1 MG PO TABS: 0.1000 mg | ORAL_TABLET | Freq: Three times a day (TID) | ORAL | Status: DC | PRN

## 2014-09-08 NOTE — Discharge Instructions (Signed)
It is very important for you to go to [facility] in the morning to help with your plan of stopping Xanax and opioids. Return to the emergency department for thoughts of wanting to hurt herself or for other concerns.   Benzodiazepine Withdrawal  Benzodiazepines are a group of drugs that are prescribed for both short-term and long-term treatment of a variety of medical conditions. For some of these conditions, such as seizures and sudden and severe muscle spasms, they are used only for a few hours or a few days. For other conditions, such as anxiety, sleep problems, or frequent muscle spasms or to help prevent seizures, they are used for an extended period, usually weeks or months. Benzodiazepines work by changing the way your brain functions. Normally, chemicals in your brain called neurotransmitters send messages between your brain cells. The neurotransmitter that benzodiazepines affect is called gamma-aminobutyric acid (GABA). GABA sends out messages that have a calming effect on many of the functions of your brain. Benzodiazepines make these messages stronger and increase this calming effect. Short-term use of benzodiazepines usually does not cause problems when you stop taking the drugs. However, if you take benzodiazepines for a long time, your body can adjust to the drug and require more of it to produce the same effect (drug tolerance). Eventually, you can develop physical dependence on benzodiazepines, which is when you experience negative effects if your dosage of benzodiazepines is reduced or stopped too quickly. These negative effects are called symptoms of withdrawal. SYMPTOMS Symptoms of withdrawal may begin anytime within the first 10 days after you stop taking the benzodiazepine. They can last from several weeks up to a few months but usually are the worst between the first 10 to 14 days.  The actual symptoms also vary, depending on the type of benzodiazepine you take. Possible symptoms  include:  Anxiety.  Excitability.  Irritability.  Depression.  Mood swings.  Trouble sleeping.  Confusion.  Uncontrollable shaking (tremors).  Muscle weakness.  Seizures. DIAGNOSIS To diagnose benzodiazepine withdrawal, your caregiver will examine you for certain signs, such as:  Rapid heartbeat.  Rapid breathing.  Tremors.  High blood pressure.  Fever.  Mood changes. Your caregiver also may ask the following questions about your use of benzodiazepines:  What type of benzodiazepine did you take?  How much did you take each day?  How long did you take the drug?  When was the last time you took the drug?  Do you take any other drugs?  Have you had alcohol recently?  Have you had a seizure recently?  Have you lost consciousness recently?  Have you had trouble remembering recent events?  Have you had a recent increase in anxiety, irritability, or trouble sleeping? A drug test also may be administered. TREATMENT The treatment for benzodiazepine withdrawal can vary, depending on the type and severity of your symptoms, what type of benzodiazepine you have been taking, and how long you have been taking the benzodiazepine. Sometimes it is necessary for you to be treated in a hospital, especially if you are at risk of seizures.  Often, treatment includes a prescription for a long-acting benzodiazepine, the dosage of which is reduced slowly over a long period. This period could be several weeks or months. Eventually, your dosage will be reduced to a point that you can stop taking the drug, without experiencing withdrawal symptoms. This is called tapered withdrawal. Occasionally, minor symptoms of withdrawal continue for a few days or weeks after you have completed a tapered withdrawal. SEEK  IMMEDIATE MEDICAL CARE IF:  You have a seizure.  You develop a craving for drugs or alcohol.  You begin to experience symptoms of withdrawal during your tapered  withdrawal.  You become very confused.  You lose consciousness.  You have trouble breathing.  You think about hurting yourself or someone else. Document Released: 03/03/2011 Document Revised: 06/06/2011 Document Reviewed: 03/03/2011 [facility] Patient Information 2015 [place], [place]. This information is not intended to replace advice given to you by your health care provider. Make sure you discuss any questions you have with your health care provider.  Opioid Withdrawal Opioids are a group of narcotic drugs. They include the street drug heroin. They also include pain medicines, such as morphine, hydrocodone, oxycodone, and fentanyl. Opioid withdrawal is a group of characteristic physical and mental signs and symptoms. It typically occurs if you have been using opioids daily for several weeks or longer and stop using or rapidly decrease use. Opioid withdrawal can also occur if you have used opioids daily for a long time and are given a medicine to block the effect.  SIGNS AND SYMPTOMS Opioid withdrawal includes three or more of the following symptoms:   Depressed, anxious, or irritable mood.  Nausea or vomiting.  Muscle aches or spasms.   Watery eyes.   Runny nose.  Dilated pupils, sweating, or hairs standing on end.  Diarrhea or intestinal cramping.  Yawning.   Fever.  Increased blood pressure.  Fast pulse.  Restlessness or trouble sleeping. These signs and symptoms occur within several hours of stopping or reducing short-acting opioids, such as heroin. They can occur within 3 days of stopping or reducing long-acting opioids, such as methadone. Withdrawal begins within minutes of receiving a drug that blocks the effects of opioids, such as naltrexone or naloxone. DIAGNOSIS  Opioid use disorder is diagnosed by your health care provider. You will be asked about your symptoms, drug and alcohol use, medical history, and use of medicines. A physical exam may be done. Lab tests  may be ordered. Your health care provider may have you see a mental health professional.  TREATMENT  The treatment for opioid withdrawal is usually provided by medical doctors with special training in substance use disorders (addiction specialists). The following medicines may be included in treatment:  Opioids given in place of the abused opioid. They turn on opioid receptors in the brain and lessen or prevent withdrawal symptoms. They are gradually decreased (opioid substitution and taper).  Non-opioids that can lessen certain opioid withdrawal symptoms. They may be used alone or with opioid substitution and taper. Successful long-term recovery usually requires medicine, counseling, and group support. HOME CARE INSTRUCTIONS   Take medicines only as directed by your health care provider.  Check with your health care provider before starting new medicines.  Keep all follow-up visits as directed by your health care provider. SEEK MEDICAL CARE IF:  You are not able to take your medicines as directed.  Your symptoms get worse.  You relapse. SEEK IMMEDIATE MEDICAL CARE IF:  You have serious thoughts about hurting yourself or others.  You have a seizure.  You lose consciousness. Document Released: 03/17/2003 Document Revised: 07/29/2013 Document Reviewed: 03/27/2013 [facility] Patient Information 2015 [place], [place]. This information is not intended to replace advice given to you by your health care provider. Make sure you discuss any questions you have with your health care provider.

## 2014-09-08 NOTE — ED Notes (Signed)

## 2014-09-08 NOTE — ED Notes (Signed)

## 2014-09-08 NOTE — ED Notes (Signed)
Requesting detox from Narcotics and xanax, for 15 to 20 years of being on each, " I will have seizures if I do not take my xanax for more than 3-4 days, last use x1 day

## 2014-09-08 NOTE — ED Provider Notes (Signed)
[facility] Emergency Department Provider Note    ____________________________________________  Time seen: 1245  I have reviewed the triage vital signs and the nursing notes.   HISTORY  Chief Complaint Drug Problem   History limited by: Not Limited   HPI Vincent Dennis is a 49 y.o. male who presents to the emergency department today with desire for detox from prescription narcotic pain medication and Xanax. Patient states he has been using both of these for many years. He states he has had seizure-like activity after not having his Xanax. Last used yesterday. Patient states his medication is partly seeing his wife who herself is having difficulties with narcotic pain medication. Patient denies any fevers.     Past Medical History  Diagnosis Date  . Chronic back pain   . Myocardial infarction   . Angina   . Hypertension   . COPD (chronic obstructive pulmonary disease)   . Shortness of breath   . Recurrent upper respiratory infection (URI)   . GERD (gastroesophageal reflux disease)   . H/O hiatal hernia   . Headache(784.0)   . Arthritis   . Mental disorder   . Anxiety   . Depression   . Seizures   . Migraines     Patient Active Problem List   Diagnosis Date Noted  . Weakness of right side of body 08/19/2011  . Tobacco use disorder 08/19/2011  . COPD (chronic obstructive pulmonary disease) 08/16/2011  . Back pain, chronic 08/16/2011  . Headache(784.0) 08/16/2011  . History of MI (myocardial infarction) 08/16/2011  . Anxiety 08/16/2011    Past Surgical History  Procedure Laterality Date  . Hernia repair    . Back surgery      Current Outpatient Rx  Name  Route  Sig  Dispense  Refill  . ALPRAZolam (XANAX) 1 MG tablet   Oral   Take 1 tablet (1 mg total) by mouth 3 (three) times daily as needed for anxiety.   15 tablet   0   . aspirin EC 81 MG tablet   Oral   Take 81 mg by mouth daily.         . Aspirin-Acetaminophen-Caffeine  (GOODYS EXTRA STRENGTH PO)   Oral   Take 2 packets by mouth 2 (two) times daily as needed (pain).         . budesonide-formoterol (SYMBICORT) 160-4.5 MCG/ACT inhaler   Inhalation   Inhale 2 puffs into the lungs 2 (two) times daily as needed (shortness of breath).          . cyclobenzaprine (FLEXERIL) 10 MG tablet   Oral   Take 1 tablet (10 mg total) by mouth 2 (two) times daily as needed for muscle spasms.   20 tablet   0   . HYDROcodone-acetaminophen (NORCO) 10-325 MG per tablet   Oral   Take 1 tablet by mouth 3 (three) times daily.         Marland Kitchen HYDROcodone-acetaminophen (NORCO/VICODIN) 5-325 MG per tablet   Oral   Take 1-2 tablets by mouth every 6 (six) hours as needed for moderate pain or severe pain. Patient not taking: Reported on 04/10/2014   15 tablet   0   . naproxen (NAPROSYN) 375 MG tablet   Oral   Take 1 tablet (375 mg total) by mouth 2 (two) times daily.   20 tablet   0   . oxyCODONE-acetaminophen (PERCOCET/ROXICET) 5-325 MG per tablet   Oral   Take 1 tablet by mouth every 6 (six)  hours as needed for severe pain.   15 tablet   0   . penicillin v potassium (VEETID) 500 MG tablet   Oral   Take 1 tablet (500 mg total) by mouth 3 (three) times daily.   30 tablet   0   . SUMAtriptan (IMITREX) 6 MG/0.5ML SOLN injection   Subcutaneous   Inject 6 mg into the skin every 2 (two) hours as needed for migraine or headache. May repeat in 2 hours if headache persists or recurs.         . traMADol (ULTRAM) 50 MG tablet   Oral   Take 25-50 mg by mouth every 6 (six) hours as needed for moderate pain.            Allergies Review of patient's allergies indicates no known allergies.  History reviewed. No pertinent family history.  Social History History  Substance Use Topics  . Smoking status: Current Every Day Smoker -- 1.00 packs/day for 31 years  . Smokeless tobacco: Not on file  . Alcohol Use: Yes     Comment: twice a year     Review of  Systems  Constitutional: Negative for fever. Cardiovascular: Negative for chest pain. Respiratory: Negative for shortness of breath. Gastrointestinal: Negative for abdominal pain, vomiting and diarrhea. Genitourinary: Negative for dysuria. Musculoskeletal: Negative for back pain. Skin: Negative for rash. Neurological: Negative for headaches, focal weakness or numbness.   10-point ROS otherwise negative.  ____________________________________________   PHYSICAL EXAM:  VITAL SIGNS: ED Triage Vitals  Enc Vitals Group     BP 09/08/14 1004 134/79 mmHg     Pulse Rate 09/08/14 1004 67     Resp 09/08/14 1004 20     Temp 09/08/14 1004 98.9 F (37.2 C)     Temp Source 09/08/14 1004 Oral     SpO2 09/08/14 1004 96 %     Weight 09/08/14 1004 220 lb (99.791 kg)     Height 09/08/14 1004  (1.88 m)     Head Cir --      Peak Flow --      Pain Score 09/08/14 1005 7   Constitutional: Alert and oriented. Well appearing and in no distress. Eyes: Conjunctivae are normal. PERRL. Normal extraocular movements. ENT   Head: Normocephalic and atraumatic.   Nose: No congestion/rhinnorhea.   Mouth/Throat: Mucous membranes are moist.   Neck: No stridor. Hematological/Lymphatic/Immunilogical: No cervical lymphadenopathy. Cardiovascular: Normal rate, regular rhythm.  No murmurs, rubs, or gallops. Respiratory: Normal respiratory effort without tachypnea nor retractions. Breath sounds are clear and equal bilaterally. No wheezes/rales/rhonchi. Gastrointestinal: Soft and nontender. No distention.  Genitourinary: Deferred Musculoskeletal: Normal range of motion in all extremities. No joint effusions.  No lower extremity tenderness nor edema. Neurologic:  Normal speech and language. No gross focal neurologic deficits are appreciated. Speech is normal.  Skin:  Skin is warm, dry and intact. No rash noted. Psychiatric: Mood and affect are normal. Speech and behavior are normal. Patient  exhibits appropriate insight and judgment.  ____________________________________________    LABS (pertinent positives/negatives)  Labs Reviewed  ACETAMINOPHEN LEVEL - Abnormal; Notable for the following:    Acetaminophen (Tylenol), Serum <10 (*)    All other components within normal limits  CBC - Abnormal; Notable for the following:    WBC 11.2 (*)    All other components within normal limits  COMPREHENSIVE METABOLIC PANEL - Abnormal; Notable for the following:    Calcium 8.7 (*)    ALT 16 (*)  All other components within normal limits  URINE DRUG SCREEN, QUALITATIVE ([facility] ONLY) - Abnormal; Notable for the following:    Cannabinoid 50 Ng, Ur [place] POSITIVE (*)    Benzodiazepine, Ur Scrn POSITIVE (*)    All other components within normal limits  URINALYSIS COMPLETEWITH MICROSCOPIC ([facility] ONLY) - Abnormal; Notable for the following:    Color, Urine YELLOW (*)    APPearance CLEAR (*)    All other components within normal limits  ETHANOL  SALICYLATE LEVEL     ____________________________________________   EKG  None  ____________________________________________    RADIOLOGY  None  ____________________________________________   PROCEDURES  Procedure(s) performed: None  Critical Care performed: No  ____________________________________________   INITIAL IMPRESSION / ASSESSMENT AND PLAN / ED COURSE  Pertinent labs & imaging results that were available during my care of the patient were reviewed by me and considered in my medical decision making (see chart for details).  Patient here for detox from opiates and Xanax. Will have Behavioral Health see the patient. Currently no signs of withdrawal symptoms.  ____________________________________________   FINAL CLINICAL IMPRESSION(S) / ED DIAGNOSES  Opiate dependence   Phineas Semen, MD 09/08/14 1646

## 2014-09-08 NOTE — ED Notes (Signed)
BEHAVIORAL HEALTH ROUNDING Patient sleeping: No. Patient alert and oriented: yes Behavior appropriate: Yes.   Nutrition and fluids offered: Yes  Toileting and hygiene offered: Yes  Sitter present: q15 min observations and security camera monitoring Law enforcement present: Yes [employer]  ENVIRONMENTAL ASSESSMENT Potentially harmful objects out of patient reach: Yes.   Personal belongings secured: Yes.   Patient dressed in hospital provided attire only: Yes.   Plastic bags out of patient reach: Yes.   Patient care equipment (cords, cables, call bells, lines, and drains) shortened, removed, or accounted for: Yes.   Equipment and supplies removed from bottom of stretcher: Yes.   Potentially toxic materials out of patient reach: Yes.   Sharps container removed or out of patient reach: Yes.   

## 2014-09-08 NOTE — ED Notes (Signed)
Per pt "I removed the nicotine patch cause it wasn't helping me.I was still craving cigarettes".

## 2014-09-08 NOTE — ED Notes (Signed)
Patient requesting nicotine patch. Verbal order from ED MD

## 2014-09-08 NOTE — ED Provider Notes (Signed)
-----------------------------------------   7:42 PM on 09/08/2014 -----------------------------------------  Patient had been requesting to be discharged because he was getting frustrated that he was not being given Xanax for his anxiety and withdrawal. He states he has been taking Xanax for 25 years sometimes 3 mg at a time. He states he has had seizures from withdrawal in the past. He still wants help for Xanax and narcotic dependence. I explained to him that we can give him Xanax at a lower dose to help him taper off while awaiting substance abuse inpatient treatment. Claris Che, behavioral medicine nurse, will contact [facility] for placement. Patient expresses understanding and is willing to wait. He also understands he may not be placed with his wife who is also in the ER for detox.  ----------------------------------------- 10:32 PM on 09/08/2014 -----------------------------------------  Claris Che has discussed with patient and he decided he would prefer to follow up with [facility] intensive outpatient treatment. He needs to proceed there tomorrow help with Xanax taper. He understands that there is a risk of seizure with Xanax withdrawal when he is not hospitalized but he still prefers outpatient treatment. He denies any SI/HI and is competent to make this choice.  Maurilio Lovely, MD 09/08/14 2234

## 2014-09-08 NOTE — ED Notes (Signed)

## 2014-09-08 NOTE — BH Assessment (Signed)
Assessment Note  Vincent Dennis is an 49 y.o. male, who presents to the ED via his daughter and wife requesting detox from "xanax and hydrocodone"; with using 3-6 xanax/day; and 3-10 hydrocodone tabs/day. Client c/o left sided pain; has had (2) back surgeries; has not had any treatment. Client states, "I want to quit now; I see what it is doing to me and those around me; I'm tired; it's too much."  Axis I: Substance Abuse Axis II: Deferred Axis III:  Past Medical History  Diagnosis Date  . Chronic back pain   . Myocardial infarction   . Angina   . Hypertension   . COPD (chronic obstructive pulmonary disease)   . Shortness of breath   . Recurrent upper respiratory infection (URI)   . GERD (gastroesophageal reflux disease)   . H/O hiatal hernia   . Headache(784.0)   . Arthritis   . Mental disorder   . Anxiety   . Depression   . Seizures   . Migraines   . TIA (transient ischemic attack)    Axis IV: economic problems, other psychosocial or environmental problems, problems with access to health care services and problems with primary support group Axis V: 51-60 moderate symptoms  Past Medical History:  Past Medical History  Diagnosis Date  . Chronic back pain   . Myocardial infarction   . Angina   . Hypertension   . COPD (chronic obstructive pulmonary disease)   . Shortness of breath   . Recurrent upper respiratory infection (URI)   . GERD (gastroesophageal reflux disease)   . H/O hiatal hernia   . Headache(784.0)   . Arthritis   . Mental disorder   . Anxiety   . Depression   . Seizures   . Migraines   . TIA (transient ischemic attack)     Past Surgical History  Procedure Laterality Date  . Back surgery      Family History: History reviewed. No pertinent family history.  Social History:  reports that he has been smoking.  He does not have any smokeless tobacco history on file. He reports that he drinks alcohol. He reports that he uses illicit drugs  (Marijuana).  Additional Social History:     CIWA: CIWA-Ar BP: 134/79 mmHg Pulse Rate: 67 COWS:    Allergies: No Known Allergies  Home Medications:  (Not in a hospital admission)  OB/GYN Status:  No LMP for male patient.  General Assessment Data Location of Assessment: [facility] ED TTS Assessment: In system Is this a Tele or Face-to-Face Assessment?: Face-to-Face Is this an Initial Assessment or a Re-assessment for this encounter?: Re-Assessment Marital status: Married Zap name:  (none) Is patient pregnant?: No Pregnancy Status: No Living Arrangements: Spouse/significant other, Children Can pt return to current living arrangement?: Yes Admission Status: Voluntary Is patient capable of signing voluntary admission?: Yes Referral Source: Self/Family/Friend Insurance type: [facility] Medicaid  Medical Screening Exam [facility] Walk-in ONLY) Medical Exam completed: Yes  Crisis Care Plan Living Arrangements: Spouse/significant other, Children Name of Psychiatrist: none Name of Therapist: none  Education Status Is patient currently in school?: No Current Grade: n/a Highest grade of school patient has completed: 8th Name of school: n/a Contact person: daughter/wife  Risk to self with the past 6 months Suicidal Ideation: No Has patient been a risk to self within the past 6 months prior to admission? : No Suicidal Intent: No Has patient had any suicidal intent within the past 6 months prior to admission? : No Is patient at  risk for suicide?:  (substance abuse) Suicidal Plan?: No Has patient had any suicidal plan within the past 6 months prior to admission? : No Access to Means: No What has been your use of drugs/alcohol within the last 12 months?: xanax; hydrocodone; xanax--3-6/day; hydrocodone--6-10/day Previous Attempts/Gestures: No How many times?: 0 Other Self Harm Risks: 0 Triggers for Past Attempts: None known Intentional Self Injurious Behavior: None Family Suicide  History: No Recent stressful life event(s): Financial Problems, Conflict (Comment) Persecutory voices/beliefs?: No Depression: No Depression Symptoms: Despondent Substance abuse history and/or treatment for substance abuse?: Yes Suicide prevention information given to non-admitted patients: Not applicable  Risk to Others within the past 6 months Homicidal Ideation: No Does patient have any lifetime risk of violence toward others beyond the six months prior to admission? : No Thoughts of Harm to Others: No Current Homicidal Intent: No Current Homicidal Plan: No Access to Homicidal Means: No Identified Victim: none History of harm to others?: No Assessment of Violence: On admission Violent Behavior Description: none Does patient have access to weapons?: No Criminal Charges Pending?: No Does patient have a court date: No Is patient on probation?: No  Psychosis Hallucinations: None noted Delusions: None noted  Mental Status Report Appearance/Hygiene: In scrubs, Disheveled Eye Contact: Fair Motor Activity: Unremarkable Speech: Logical/coherent Level of Consciousness: Quiet/awake Mood: Helpless Affect: Anxious Anxiety Level: Minimal Thought Processes: Circumstantial, Coherent Judgement: Partial Orientation: Person, Place, Situation Obsessive Compulsive Thoughts/Behaviors: Minimal  Cognitive Functioning Concentration: Good Memory: Recent Intact, Remote Intact IQ: Average Insight: Fair Impulse Control: Fair Appetite: Good Weight Loss: 0 Weight Gain: 0 Sleep: No Change Total Hours of Sleep: 6 Vegetative Symptoms: None  ADLScreening [facility] Assessment Services) Patient's cognitive ability adequate to safely complete daily activities?: Yes Patient able to express need for assistance with ADLs?: Yes Independently performs ADLs?: Yes (appropriate for developmental age)  Prior Inpatient Therapy Prior Inpatient Therapy: No  Prior Outpatient Therapy Prior Outpatient  Therapy: No Does patient have an ACCT team?: No Does patient have Intensive In-House Services?  : No Does patient have [facility] services? : No Does patient have P4CC services?: No  ADL Screening (condition at time of admission) Patient's cognitive ability adequate to safely complete daily activities?: Yes Patient able to express need for assistance with ADLs?: Yes Independently performs ADLs?: Yes (appropriate for developmental age)       Abuse/Neglect Assessment (Assessment to be complete while patient is alone) Physical Abuse: Denies Verbal Abuse: Denies Sexual Abuse: Denies Exploitation of patient/patient's resources: Denies Self-Neglect: Denies Values / Beliefs Cultural Requests During Hospitalization: None Spiritual Requests During Hospitalization: None Consults Spiritual Care Consult Needed: No Social Work Consult Needed: No Merchant navy officer (For Healthcare) Does patient have an advance directive?: No Would patient like information on creating an advanced directive?: No - patient declined information    Additional Information 1:1 In Past 12 Months?: No CIRT Risk: No Elopement Risk: No Does patient have medical clearance?: Yes  Child/Adolescent Assessment Running Away Risk: Denies Bed-Wetting: Denies Destruction of Property: Denies Cruelty to Animals: Denies Stealing: Denies Rebellious/Defies Authority: Denies Satanic Involvement: Denies Archivist: Denies Problems at [employer]: Denies Gang Involvement: Denies  Disposition:  Disposition Initial Assessment Completed for this Encounter: Yes Disposition of Patient: Referred to (Psych MD to see) Patient referred to: Other (Comment) (consult)  On Site Evaluation by:   Reviewed with Physician:    Dwan Bolt 09/08/2014 5:58 PM

## 2014-09-08 NOTE — ED Notes (Signed)
BEHAVIORAL HEALTH ROUNDING Patient sleeping: No. Patient alert and oriented: yes Behavior appropriate: Yes.   Nutrition and fluids offered: Yes  Toileting and hygiene offered: Yes  Sitter present: q15 min observations Law enforcement present: Yes [employer] 

## 2014-10-19 ENCOUNTER — Emergency Department: Payer: Medicaid Other

## 2014-10-19 ENCOUNTER — Emergency Department
Admission: EM | Admit: 2014-10-19 | Discharge: 2014-10-19 | Disposition: A | Discharge status: Home / Self Care | Payer: Medicaid Other | Attending: Emergency Medicine | Admitting: Emergency Medicine

## 2014-10-19 DIAGNOSIS — Y998 Other external cause status: Secondary | ICD-10-CM | POA: Diagnosis not present

## 2014-10-19 DIAGNOSIS — S4991XA Unspecified injury of right shoulder and upper arm, initial encounter: Secondary | ICD-10-CM | POA: Insufficient documentation

## 2014-10-19 DIAGNOSIS — Y92410 Unspecified street and highway as the place of occurrence of the external cause: Secondary | ICD-10-CM | POA: Diagnosis not present

## 2014-10-19 DIAGNOSIS — Z720 Tobacco use: Secondary | ICD-10-CM | POA: Diagnosis not present

## 2014-10-19 DIAGNOSIS — Y9389 Activity, other specified: Secondary | ICD-10-CM | POA: Insufficient documentation

## 2014-10-19 DIAGNOSIS — S199XXA Unspecified injury of neck, initial encounter: Secondary | ICD-10-CM | POA: Diagnosis present

## 2014-10-19 DIAGNOSIS — S161XXA Strain of muscle, fascia and tendon at neck level, initial encounter: Secondary | ICD-10-CM | POA: Diagnosis not present

## 2014-10-19 HISTORY — DX: Acute myocardial infarction, unspecified: I21.9

## 2014-10-19 HISTORY — DX: Emphysema, unspecified: J43.9

## 2014-10-19 MED ORDER — IBUPROFEN 800 MG PO TABS
800.0000 mg | ORAL_TABLET | Freq: Once | ORAL | Status: AC
  Administered 2014-10-19: 800 mg via ORAL
  Filled 2014-10-19: qty 1

## 2014-10-19 MED ORDER — IBUPROFEN 800 MG PO TABS: 800.0000 mg | ORAL_TABLET | Freq: Three times a day (TID) | ORAL | Status: DC | PRN
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
## 2014-10-19 NOTE — Discharge Instructions (Signed)
Cervical Sprain °A cervical sprain is when the tissues (ligaments) that hold the neck bones in place stretch or tear. °HOME CARE  °· Put ice on the injured area. °¨ Put ice in a plastic bag. °¨ Place a towel between your skin and the bag. °¨ Leave the ice on for 15-20 minutes, 3-4 times a day. °· You may have been given a collar to wear. This collar keeps your neck from moving while you heal. °¨ Do not take the collar off unless told by your doctor. °¨ If you have long hair, keep it outside of the collar. °¨ Ask your doctor before changing the position of your collar. You may need to change its position over time to make it more comfortable. °¨ If you are allowed to take off the collar for cleaning or bathing, follow your doctor's instructions on how to do it safely. °¨ Keep your collar clean by wiping it with mild soap and water. Dry it completely. If the collar has removable pads, remove them every 1-2 days to hand wash them with soap and water. Allow them to air dry. They should be dry before you wear them in the collar. °¨ Do not drive while wearing the collar. °· Only take medicine as told by your doctor. °· Keep all doctor visits as told. °· Keep all physical therapy visits as told. °· Adjust your work station so that you have good posture while you work. °· Avoid positions and activities that make your problems worse. °· Warm up and stretch before being active. °GET HELP IF: °· Your pain is not controlled with medicine. °· You cannot take less pain medicine over time as planned. °· Your activity level does not improve as expected. °GET HELP RIGHT AWAY IF:  °· You are bleeding. °· Your stomach is upset. °· You have an allergic reaction to your medicine. °· You develop new problems that you cannot explain. °· You lose feeling (become numb) or you cannot move any part of your body (paralysis). °· You have tingling or weakness in any part of your body. °· Your symptoms get worse. Symptoms include: °· Pain,  soreness, stiffness, puffiness (swelling), or a burning feeling in your neck. °· Pain when your neck is touched. °· Shoulder or upper back pain. °· Limited ability to move your neck. °· Headache. °· Dizziness. °· Your hands or arms feel week, lose feeling, or tingle. °· Muscle spasms. °· Difficulty swallowing or chewing. °MAKE SURE YOU:  °· Understand these instructions. °· Will watch your condition. °· Will get help right away if you are not doing well or get worse. °Document Released: 08/31/2007 Document Revised: 11/14/2012 Document Reviewed: 09/19/2012 °ExitCare® Patient Information ©2015 [employer]. This information is not intended to replace advice given to you by your health care provider. Make sure you discuss any questions you have with your health care provider. ° °Motor Vehicle Collision °It is common to have multiple bruises and sore muscles after a motor vehicle collision (MVC). These tend to feel worse for the first 24 hours. You may have the most stiffness and soreness over the first several hours. You may also feel worse when you wake up the first morning after your collision. After this point, you will usually begin to improve with each day. The speed of improvement often depends on the severity of the collision, the number of injuries, and the location and nature of these injuries. °HOME CARE INSTRUCTIONS °· Put ice on the injured area. °¨   Put ice in a plastic bag. °¨ Place a towel between your skin and the bag. °¨ Leave the ice on for 15-20 minutes, 3-4 times a day, or as directed by your health care provider. °· Drink enough fluids to keep your urine clear or pale yellow. Do not drink alcohol. °· Take a warm shower or bath once or twice a day. This will increase blood flow to sore muscles. °· You may return to activities as directed by your caregiver. Be careful when lifting, as this may aggravate neck or back pain. °· Only take over-the-counter or prescription medicines for pain, discomfort,  or fever as directed by your caregiver. Do not use aspirin. This may increase bruising and bleeding. °SEEK IMMEDIATE MEDICAL CARE IF: °· You have numbness, tingling, or weakness in the arms or legs. °· You develop severe headaches not relieved with medicine. °· You have severe neck pain, especially tenderness in the middle of the back of your neck. °· You have changes in bowel or bladder control. °· There is increasing pain in any area of the body. °· You have shortness of breath, light-headedness, dizziness, or fainting. °· You have chest pain. °· You feel sick to your stomach (nauseous), throw up (vomit), or sweat. °· You have increasing abdominal discomfort. °· There is blood in your urine, stool, or vomit. °· You have pain in your shoulder (shoulder strap areas). °· You feel your symptoms are getting worse. °MAKE SURE YOU: °· Understand these instructions. °· Will watch your condition. °· Will get help right away if you are not doing well or get worse. °Document Released: 03/14/2005 Document Revised: 07/29/2013 Document Reviewed: 08/11/2010 °Exit[employer]® Patient Information ©2015 [employer]. This information is not intended to replace advice given to you by your health care provider. Make sure you discuss any questions you have with your health care provider. ° °

## 2014-10-19 NOTE — ED Notes (Signed)
Care assumed. Pt resting in bed without distress. 
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
## 2014-12-24 ENCOUNTER — Emergency Department (HOSPITAL_COMMUNITY): Payer: Medicaid Other

## 2014-12-24 ENCOUNTER — Encounter (HOSPITAL_COMMUNITY): Payer: Self-pay | Admitting: Emergency Medicine

## 2014-12-24 ENCOUNTER — Emergency Department (HOSPITAL_COMMUNITY)
Admission: EM | Admit: 2014-12-24 | Discharge: 2014-12-24 | Disposition: A | Discharge status: Home / Self Care | Payer: Medicaid Other | Attending: Emergency Medicine | Admitting: Emergency Medicine

## 2014-12-24 DIAGNOSIS — Z720 Tobacco use: Secondary | ICD-10-CM | POA: Diagnosis not present

## 2014-12-24 DIAGNOSIS — S61012A Laceration without foreign body of left thumb without damage to nail, initial encounter: Secondary | ICD-10-CM | POA: Diagnosis present

## 2014-12-24 DIAGNOSIS — M1990 Unspecified osteoarthritis, unspecified site: Secondary | ICD-10-CM | POA: Diagnosis not present

## 2014-12-24 DIAGNOSIS — F329 Major depressive disorder, single episode, unspecified: Secondary | ICD-10-CM | POA: Insufficient documentation

## 2014-12-24 DIAGNOSIS — I252 Old myocardial infarction: Secondary | ICD-10-CM | POA: Diagnosis not present

## 2014-12-24 DIAGNOSIS — I209 Angina pectoris, unspecified: Secondary | ICD-10-CM | POA: Insufficient documentation

## 2014-12-24 DIAGNOSIS — Z8719 Personal history of other diseases of the digestive system: Secondary | ICD-10-CM | POA: Insufficient documentation

## 2014-12-24 DIAGNOSIS — Y9389 Activity, other specified: Secondary | ICD-10-CM | POA: Insufficient documentation

## 2014-12-24 DIAGNOSIS — J449 Chronic obstructive pulmonary disease, unspecified: Secondary | ICD-10-CM | POA: Diagnosis not present

## 2014-12-24 DIAGNOSIS — G8929 Other chronic pain: Secondary | ICD-10-CM | POA: Diagnosis not present

## 2014-12-24 DIAGNOSIS — Y998 Other external cause status: Secondary | ICD-10-CM | POA: Insufficient documentation

## 2014-12-24 DIAGNOSIS — Y9289 Other specified places as the place of occurrence of the external cause: Secondary | ICD-10-CM | POA: Diagnosis not present

## 2014-12-24 DIAGNOSIS — Z23 Encounter for immunization: Secondary | ICD-10-CM | POA: Diagnosis not present

## 2014-12-24 DIAGNOSIS — W260XXA Contact with knife, initial encounter: Secondary | ICD-10-CM | POA: Diagnosis not present

## 2014-12-24 DIAGNOSIS — Z79899 Other long term (current) drug therapy: Secondary | ICD-10-CM | POA: Insufficient documentation

## 2014-12-24 DIAGNOSIS — Z8673 Personal history of transient ischemic attack (TIA), and cerebral infarction without residual deficits: Secondary | ICD-10-CM | POA: Insufficient documentation

## 2014-12-24 MED ORDER — OXYCODONE-ACETAMINOPHEN 5-325 MG PO TABS: 1.0000 | ORAL_TABLET | Freq: Once | ORAL | Status: DC

## 2014-12-24 MED ORDER — OXYCODONE-ACETAMINOPHEN 5-325 MG PO TABS
1.0000 | ORAL_TABLET | Freq: Once | ORAL | Status: AC
  Administered 2014-12-24: 1 via ORAL
  Filled 2014-12-24: qty 1

## 2014-12-24 MED ORDER — TETANUS-DIPHTH-ACELL PERTUSSIS 5-2.5-18.5 LF-MCG/0.5 IM SUSP
0.5000 mL | Freq: Once | INTRAMUSCULAR | Status: AC
  Administered 2014-12-24: 0.5 mL via INTRAMUSCULAR
  Filled 2014-12-24: qty 0.5

## 2014-12-24 MED ORDER — TRANEXAMIC ACID 1000 MG/10ML IV SOLN
500.0000 mg | Freq: Once | INTRAVENOUS | Status: AC
  Administered 2014-12-24: 1000 mg via TOPICAL
  Filled 2014-12-24: qty 10

## 2014-12-24 MED ORDER — KETOROLAC TROMETHAMINE 60 MG/2ML IM SOLN
60.0000 mg | Freq: Once | INTRAMUSCULAR | Status: DC
  Filled 2014-12-24: qty 2
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
## 2014-12-24 NOTE — ED Provider Notes (Signed)
CSN: [id number]   Arrival date & time 12/24/14 0129  History  By signing my name below, I, Bethel Born, attest that this documentation has been prepared under the direction and in the presence of April Palumbo, MD. Electronically Signed: Bethel Born, ED Scribe. 12/24/2014. 2:41 AM.  Chief Complaint  Patient presents with  . Laceration    HPI Patient is a 49 y.o. male presenting with skin laceration. The history is provided by the patient. No language interpreter was used.  Laceration Location:  Finger Finger laceration location:  L thumb Length (cm):  1 cm Depth:  Through dermis Quality: straight   Bleeding: venous and controlled with pressure   Laceration mechanism:  Metal edge Pain details:    Quality:  Aching   Severity:  Severe   Timing:  Constant   Progression:  Unchanged Foreign body present:  No foreign bodies Relieved by:  Nothing Worsened by:  Movement and pressure Ineffective treatments:  None tried Tetanus status:  Out of date  Vincent Dennis is a 49 y.o. male who presents to the Emergency Department complaining of left thumb laceration with onset last evening. He was attempting to open a box with his pocket knife and cit his finger. He last used BC powder 2 days ago. No other anticoagulation. He is not sure when his last tetanus shot was. On Xanax and albuterol daily.    Past Medical History  Diagnosis Date  . Chronic back pain   . Myocardial infarction   . Angina   . COPD (chronic obstructive pulmonary disease)   . Shortness of breath   . Recurrent upper respiratory infection (URI)   . GERD (gastroesophageal reflux disease)   . H/O hiatal hernia   . Headache(784.0)   . Arthritis   . Mental disorder   . Anxiety   . Depression   . Seizures   . Migraines   . TIA (transient ischemic attack)   . Emphysema of lung   . Heart attack     Past Surgical History  Procedure Laterality Date  . Back surgery      No family history on file.  Social  History  Substance Use Topics  . Smoking status: Current Every Day Smoker -- 0.00 packs/day for 31 years  . Smokeless tobacco: None  . Alcohol Use: Yes     Review of Systems  Musculoskeletal:       Left thumb laceration  All other systems reviewed and are negative.  Home Medications   Prior to Admission medications   Medication Sig Start Date End Date Taking? Authorizing Provider  albuterol (PROVENTIL HFA;VENTOLIN HFA) 108 (90 BASE) MCG/ACT inhaler Inhale 1 puff into the lungs every 6 (six) hours as needed for wheezing or shortness of breath.   Yes Historical Provider, MD  ALPRAZolam Prudy Feeler) 1 MG tablet Take 1 tablet (1 mg total) by mouth 3 (three) times daily as needed for anxiety. 10/02/13  Yes Junious Silk, PA-C  Diphenhydramine-APAP, sleep, (GOODYS PM PO) Take 1 packet by mouth at bedtime as needed (for sleep).   Yes Historical Provider, MD  cloNIDine (CATAPRES) 0.1 MG tablet Take 1 tablet (0.1 mg total) by mouth 3 (three) times daily as needed (opioid withdrawl symptoms). Patient not taking: Reported on 12/24/2014 09/08/14 09/08/15  Maurilio Lovely, MD  cyclobenzaprine (FLEXERIL) 10 MG tablet Take 1 tablet (10 mg total) by mouth 2 (two) times daily as needed for muscle spasms. Patient not taking: Reported on 09/08/2014 06/05/14   Marlon Pel, PA-C  HYDROcodone-acetaminophen (NORCO/VICODIN) 5-325 MG per tablet Take 1-2 tablets by mouth every 6 (six) hours as needed for moderate pain or severe pain. Patient not taking: Reported on 04/10/2014 10/02/13   Junious Silk, PA-C  ibuprofen (ADVIL,MOTRIN) 800 MG tablet Take 1 tablet (800 mg total) by mouth every 8 (eight) hours as needed. Patient not taking: Reported on 12/24/2014 10/19/14   Minna Antis, MD  naproxen (NAPROSYN) 375 MG tablet Take 1 tablet (375 mg total) by mouth 2 (two) times daily. Patient not taking: Reported on 09/08/2014 06/05/14   Marlon Pel, PA-C  oxyCODONE-acetaminophen (PERCOCET/ROXICET) 5-325 MG per tablet Take 1  tablet by mouth every 6 (six) hours as needed for severe pain. Patient not taking: Reported on 09/08/2014 06/05/14   Marlon Pel, PA-C  penicillin v potassium (VEETID) 500 MG tablet Take 1 tablet (500 mg total) by mouth 3 (three) times daily. Patient not taking: Reported on 09/08/2014 07/16/14   Dahlia Client Muthersbaugh, PA-C    Allergies  Review of patient's allergies indicates no known allergies.  Triage Vitals: BP 143/78 mmHg  Pulse 80  Temp(Src) 98.1 F (36.7 C) (Oral)  Resp 14  SpO2 96%  Physical Exam  Constitutional: He is oriented to person, place, and time. He appears well-developed and well-nourished. No distress.  HENT:  Head: Normocephalic and atraumatic.  Mouth/Throat: Oropharynx is clear and moist.  Moist mucous membranes No exudate No battle sign No racoon eyes  Eyes: EOM are normal. Pupils are equal, round, and reactive to light.  Neck: Normal range of motion. Neck supple.  Cardiovascular: Normal rate and regular rhythm.   Pulmonary/Chest: Effort normal and breath sounds normal. No respiratory distress. He has no wheezes. He has no rales.  CTAB  Abdominal: Soft. Bowel sounds are normal. There is no tenderness. There is no rebound and no guarding.  Musculoskeletal: Normal range of motion.       Left hand: He exhibits laceration. He exhibits normal range of motion, no bony tenderness, normal two-point discrimination, normal capillary refill, no deformity and no swelling. Normal sensation noted. Normal strength noted.       Hands: Demi lunar1 cm laceration just proximal to IP joint of left thumb.  Capillary refill less than 2 seconds FROM  Neurological: He is alert and oriented to person, place, and time. He has normal reflexes.  Skin: Skin is warm and dry.  Psychiatric: He has a normal mood and affect. His behavior is normal.  Nursing note and vitals reviewed.   ED Course  Procedures   DIAGNOSTIC STUDIES: Oxygen Saturation is 96% on RA, normal by my  interpretation.    COORDINATION OF CARE: 2:37 AM Discussed treatment plan which includes XR and pain management with pt at bedside and pt agreed to plan.  Labs Reviewed - No data to display  Imaging Review No results found.    MDM   Final diagnoses:  None   Sutured by Earley Favor.  No tendon or vascular injury by me.  Will d/c suture removal in 10 days at urgent care  No narcotics, if you feel you need narcotics you will have to follow up with your PMD  I personally performed the services described in this documentation, which was scribed in my presence. The recorded information has been reviewed and is accurate.     Cy Blamer, MD 12/24/14 [phone]

## 2014-12-24 NOTE — ED Notes (Signed)
Wound seal and quick clot applied to wound. Tight Coban dressing applied as well.

## 2014-12-24 NOTE — ED Provider Notes (Signed)
LACERATION REPAIR Performed by: Arman Filter Authorized by: Arman Filter Consent: Verbal consent obtained. Risks and benefits: risks, benefits and alternatives were discussed Consent given by: patient Patient identity confirmed: provided demographic data Prepped and Draped in normal sterile fashion Wound explored  Laceration Location: L thumb  Laceration Length: 1.5cm  No Foreign Bodies seen or palpated  Anesthesia: local infiltration  Local anesthetic: lidocaine 1% without epinephrine  Anesthetic total: 3 ml  Irrigation method: syringe Amount of cleaning: standard  Skin closure: simple blunter update   Number of sutures: 5  Technique: Interrupted   Patient tolerance: Patient tolerated the procedure well with no immediate complications.  Earley Favor, NP 12/24/14 0542  Cy Blamer, MD 12/24/14 [phone]

## 2014-12-24 NOTE — ED Notes (Addendum)
NP Schulz bedside. 

## 2015-01-22 ENCOUNTER — Telehealth: Payer: Self-pay | Admitting: Cardiology

## 2015-01-22 NOTE — Telephone Encounter (Signed)
Received records from Dr Lerry Linerwight Williams for appointment on 02/06/15 with Dr Antoine PocheHochrein.  [facility] Dr Hochrein's schedule on 02/06/15. lp

## 2015-02-06 ENCOUNTER — Ambulatory Visit: Payer: Medicaid Other | Admitting: Cardiology

## 2015-02-06 DIAGNOSIS — R0989 Other specified symptoms and signs involving the circulatory and respiratory systems: Secondary | ICD-10-CM

## 2015-02-24 ENCOUNTER — Emergency Department
Admission: EM | Admit: 2015-02-24 | Discharge: 2015-02-25 | Disposition: A | Discharge status: Home / Self Care | Payer: Medicaid Other | Attending: Emergency Medicine | Admitting: Emergency Medicine

## 2015-02-24 ENCOUNTER — Encounter: Payer: Self-pay | Admitting: Urgent Care

## 2015-02-24 DIAGNOSIS — M25569 Pain in unspecified knee: Secondary | ICD-10-CM | POA: Diagnosis not present

## 2015-02-24 DIAGNOSIS — M7918 Myalgia, other site: Secondary | ICD-10-CM

## 2015-02-24 DIAGNOSIS — F17200 Nicotine dependence, unspecified, uncomplicated: Secondary | ICD-10-CM | POA: Insufficient documentation

## 2015-02-24 DIAGNOSIS — M549 Dorsalgia, unspecified: Secondary | ICD-10-CM | POA: Insufficient documentation

## 2015-02-24 DIAGNOSIS — R55 Syncope and collapse: Secondary | ICD-10-CM | POA: Insufficient documentation

## 2015-02-24 DIAGNOSIS — K029 Dental caries, unspecified: Secondary | ICD-10-CM

## 2015-02-24 DIAGNOSIS — G8929 Other chronic pain: Secondary | ICD-10-CM | POA: Diagnosis not present

## 2015-02-24 DIAGNOSIS — R569 Unspecified convulsions: Secondary | ICD-10-CM | POA: Insufficient documentation

## 2015-02-24 LAB — BASIC METABOLIC PANEL
ANION GAP: UNDETERMINED (ref 5–15)
BUN: 14 mg/dL (ref 6–20)
CHLORIDE: 106 mmol/L (ref 101–111)
CO2: 27 mmol/L (ref 22–32)
Calcium: 9.2 mg/dL (ref 8.9–10.3)
Creatinine, Ser: 0.96 mg/dL (ref 0.61–1.24)
GFR calc Af Amer: >60 mL/min (ref >60)
GLUCOSE: 124 mg/dL — AB (ref 65–99)
POTASSIUM: 3.6 mmol/L (ref 3.5–5.1)
SODIUM: UNDETERMINED mmol/L (ref 135–145)

## 2015-02-24 LAB — CBC
HEMATOCRIT: 42.7 % (ref 40.0–52.0)
HEMOGLOBIN: 14.8 g/dL (ref 13.0–18.0)
MCH: 30.4 pg (ref 26.0–34.0)
MCHC: 34.7 g/dL (ref 32.0–36.0)
MCV: 87.7 fL (ref 80.0–100.0)
Platelets: 214 10*3/uL (ref 150–440)
RBC: 4.87 MIL/uL (ref 4.40–5.90)
RDW: 12.5 % (ref 11.5–14.5)
WBC: 9.5 10*3/uL (ref 3.8–10.6)

## 2015-02-24 NOTE — ED Notes (Signed)
Patient presents with multiple c/o... multiple seizures this week (had 3-4 this week). Patient reporting questionable seizure in his sleep 3 nights ago - woke up having bit his tongue and have AMS. Patient also reporting a "lump" to his RIGHT neck that is pain. (+) known dental caries - has been seen here in the past for the same - told that he had elevated WBC because of infection in his body.

## 2015-02-25 ENCOUNTER — Emergency Department: Payer: Medicaid Other

## 2015-02-25 LAB — URINALYSIS COMPLETE WITH MICROSCOPIC (ARMC ONLY)
BACTERIA UA: NONE SEEN
Bilirubin Urine: NEGATIVE
Glucose, UA: NEGATIVE mg/dL
Hgb urine dipstick: NEGATIVE
Ketones, ur: NEGATIVE mg/dL
Leukocytes, UA: NEGATIVE
Nitrite: NEGATIVE
PROTEIN: NEGATIVE mg/dL
RBC / HPF: NONE SEEN RBC/hpf (ref 0–5)
SPECIFIC GRAVITY, URINE: 1.014 (ref 1.005–1.030)
SQUAMOUS EPITHELIAL / LPF: NONE SEEN
pH: 7 (ref 5.0–8.0)

## 2015-02-25 LAB — TROPONIN I: Troponin I: <0.03 ng/mL (ref <0.031)

## 2015-02-25 MED ORDER — SODIUM CHLORIDE 0.9 % IV BOLUS (SEPSIS)
1000.0000 mL | Freq: Once | INTRAVENOUS | Status: AC
  Administered 2015-02-25: 1000 mL via INTRAVENOUS

## 2015-02-25 MED ORDER — LIDOCAINE VISCOUS 2 % MT SOLN
15.0000 mL | Freq: Once | OROMUCOSAL | Status: AC
  Administered 2015-02-25: 15 mL via OROMUCOSAL
  Filled 2015-02-25: qty 15

## 2015-02-25 MED ORDER — TRAMADOL HCL 50 MG PO TABS
50.0000 mg | ORAL_TABLET | Freq: Once | ORAL | Status: AC
Start: 2015-02-25 — End: 2015-02-25
  Administered 2015-02-25: 50 mg via ORAL
  Filled 2015-02-25: qty 1

## 2015-02-25 MED ORDER — LEVETIRACETAM 500 MG PO TABS: 500.0000 mg | ORAL_TABLET | Freq: Two times a day (BID) | ORAL | Status: DC

## 2015-02-25 MED ORDER — CEPHALEXIN 500 MG PO CAPS: 500.0000 mg | ORAL_CAPSULE | Freq: Four times a day (QID) | ORAL | Status: AC

## 2015-02-25 MED ORDER — KETOROLAC TROMETHAMINE 30 MG/ML IJ SOLN
30.0000 mg | Freq: Once | INTRAMUSCULAR | Status: AC
  Administered 2015-02-25: 30 mg via INTRAVENOUS
  Filled 2015-02-25: qty 1

## 2015-02-25 MED ORDER — SODIUM CHLORIDE 0.9 % IV SOLN
1000.0000 mg | Freq: Once | INTRAVENOUS | Status: AC
  Administered 2015-02-25: 1000 mg via INTRAVENOUS
  Filled 2015-02-25: qty 10

## 2015-02-25 MED ORDER — CEPHALEXIN 500 MG PO CAPS
500.0000 mg | ORAL_CAPSULE | Freq: Once | ORAL | Status: AC
  Administered 2015-02-25: 500 mg via ORAL
  Filled 2015-02-25: qty 1

## 2015-02-25 MED ORDER — ETODOLAC 200 MG PO CAPS: 200.0000 mg | ORAL_CAPSULE | Freq: Three times a day (TID) | ORAL | Status: DC

## 2015-02-25 NOTE — ED Notes (Signed)
Patient transported to CT 

## 2015-02-25 NOTE — ED Notes (Signed)
Patient transported to X-ray 

## 2015-02-25 NOTE — ED Provider Notes (Signed)
[facility] Emergency Department Provider Note  ____________________________________________  Time seen: Approximately 0017 AM  I have reviewed the triage vital signs and the nursing notes.   HISTORY  Chief Complaint Seizures    HPI Vincent Dennis is a 49 y.o. male who comes into the hospital today with seizures, dental pain and syncopal. The patient reports that the last few days he has been having couple of seizures. He reports that a few nights ago he bit his tongue and he has multiple dental caries and his teeth are hurting. The patient reports that he was unable to give for Thanksgiving and feels pressure in his mouth. He also reports that he feels as though something is gone down his neck and is having a knot in his neck. The patient has a dentist appointment on December 11 but reports that he doesn't think he can wait that long. He reports that he also feels that he doesn't know things he should know. He reports that he did not remember the president and his motor skills seemed to be off. The patient has not been sleeping due to pain and has not been eating or drinking much. The patient reports his pain is a 10 out of 10 in intensity. He reports that he does not take seizure medicine although he's had seizures for the last year and a half to 2 years. He reports the take Xanax to help with his anxiety and seizures. When the patient was being brought to a room he fell but is not complaining of any other pain this time. He reports that he thinks he may have blacked out. When asked how often he has seizures the patient is unable to tell me exactly mouth.The patient has a history of chronic pain and reports that he has been off of his pain meds for some time by his own wishes.    Past Medical History  Diagnosis Date  . Chronic back pain   . Myocardial infarction (HCC)   . Angina   . COPD (chronic obstructive pulmonary disease) (HCC)   . Shortness of breath   .  Recurrent upper respiratory infection (URI)   . GERD (gastroesophageal reflux disease)   . H/O hiatal hernia   . Headache(784.0)   . Arthritis   . Mental disorder   . Anxiety   . Depression   . Seizures (HCC)   . Migraines   . TIA (transient ischemic attack)   . Emphysema of lung (HCC)   . Heart attack [facility](HCC)     Patient Active Problem List   Diagnosis Date Noted  . Weakness of right side of body 08/19/2011  . Tobacco use disorder 08/19/2011  . COPD (chronic obstructive pulmonary disease) (HCC) 08/16/2011  . Back pain, chronic 08/16/2011  . Headache(784.0) 08/16/2011  . History of MI (myocardial infarction) 08/16/2011  . Anxiety 08/16/2011    Past Surgical History  Procedure Laterality Date  . Back surgery      Current Outpatient Rx  Name  Route  Sig  Dispense  Refill  . albuterol (PROVENTIL HFA;VENTOLIN HFA) 108 (90 BASE) MCG/ACT inhaler   Inhalation   Inhale 1 puff into the lungs every 6 (six) hours as needed for wheezing or shortness of breath.         . ALPRAZolam (XANAX) 1 MG tablet   Oral   Take 1 tablet (1 mg total) by mouth 3 (three) times daily as needed for anxiety.   15 tablet  0   . cephALEXin (KEFLEX) 500 MG capsule   Oral   Take 1 capsule (500 mg total) by mouth 4 (four) times daily.   20 capsule   0   . cloNIDine (CATAPRES) 0.1 MG tablet   Oral   Take 1 tablet (0.1 mg total) by mouth 3 (three) times daily as needed (opioid withdrawl symptoms). Patient not taking: Reported on 12/24/2014   30 tablet   0   . cyclobenzaprine (FLEXERIL) 10 MG tablet   Oral   Take 1 tablet (10 mg total) by mouth 2 (two) times daily as needed for muscle spasms. Patient not taking: Reported on 09/08/2014   20 tablet   0   . Diphenhydramine-APAP, sleep, (GOODYS PM PO)   Oral   Take 1 packet by mouth at bedtime as needed (for sleep).         Marland Kitchen etodolac (LODINE) 200 MG capsule   Oral   Take 1 capsule (200 mg total) by mouth every 8 (eight) hours.   12  capsule   0   . HYDROcodone-acetaminophen (NORCO/VICODIN) 5-325 MG per tablet   Oral   Take 1-2 tablets by mouth every 6 (six) hours as needed for moderate pain or severe pain. Patient not taking: Reported on 04/10/2014   15 tablet   0   . ibuprofen (ADVIL,MOTRIN) 800 MG tablet   Oral   Take 1 tablet (800 mg total) by mouth every 8 (eight) hours as needed. Patient not taking: Reported on 12/24/2014   30 tablet   0   . levETIRAcetam (KEPPRA) 500 MG tablet   Oral   Take 1 tablet (500 mg total) by mouth 2 (two) times daily.   20 tablet   0   . naproxen (NAPROSYN) 375 MG tablet   Oral   Take 1 tablet (375 mg total) by mouth 2 (two) times daily. Patient not taking: Reported on 09/08/2014   20 tablet   0   . oxyCODONE-acetaminophen (PERCOCET/ROXICET) 5-325 MG per tablet   Oral   Take 1 tablet by mouth every 6 (six) hours as needed for severe pain. Patient not taking: Reported on 09/08/2014   15 tablet   0   . penicillin v potassium (VEETID) 500 MG tablet   Oral   Take 1 tablet (500 mg total) by mouth 3 (three) times daily. Patient not taking: Reported on 09/08/2014   30 tablet   0     Allergies Review of patient's allergies indicates no known allergies.  No family history on file.  Social History Social History  Substance Use Topics  . Smoking status: Current Every Day Smoker -- 0.00 packs/day for 31 years  . Smokeless tobacco: None  . Alcohol Use: No    Review of Systems Constitutional: No fever/chills Eyes: No visual changes. ENT: dental pain Cardiovascular: Denies chest pain. Respiratory: Denies shortness of breath. Gastrointestinal: No abdominal pain.  No nausea, no vomiting.  No diarrhea.  No constipation. Genitourinary: Negative for dysuria. Musculoskeletal:chronic knee and back pain Skin: Negative for rash. Neurological: seizure and syncope  10-point ROS otherwise negative.  ____________________________________________   PHYSICAL EXAM:  VITAL  SIGNS: ED Triage Vitals  Enc Vitals Group     BP 02/24/15 2137 131/80 mmHg     Pulse Rate 02/24/15 2137 84     Resp 02/25/15 0130 13     Temp 02/24/15 2137 98.6 F (37 C)     Temp Source 02/24/15 2137 Oral     SpO2 02/24/15  2137 93 %     Weight --      Height --      Head Cir --      Peak Flow --      Pain Score 02/24/15 2144 9     Pain Loc --      Pain Edu? --      Excl. in GC? --     Constitutional: Alert and oriented. Disheveled appearing and in some moderate distress Eyes: Conjunctivae are normal. PERRL. EOMI. Head: Atraumatic. Nose: No congestion/rhinnorhea. Mouth/Throat: Mucous membranes are moist.  Oropharynx non-erythematous. Extensive dental decay with pain to palpation of multiple teeth all over mouth. Healing laceration to right side of tongue  Cardiovascular: Normal rate, regular rhythm. Grossly normal heart sounds.  Good peripheral circulation. Respiratory: Normal respiratory effort.  No retractions. Lungs CTAB. Gastrointestinal: Soft and nontender. No distention. Positive bowel sounds Musculoskeletal: No lower extremity tenderness nor edema.   Neurologic:  Normal speech and language. No gross focal neurologic deficits are appreciated.  Skin:  Skin is warm, dry and intact.  Psychiatric: Mood and affect are normal.   ____________________________________________   LABS (all labs ordered are listed, but only abnormal results are displayed)  Labs Reviewed  BASIC METABOLIC PANEL - Abnormal; Notable for the following:    Glucose, Bld 124 (*)    All other components within normal limits  URINALYSIS COMPLETEWITH MICROSCOPIC ([facility] ONLY) - Abnormal; Notable for the following:    Color, Urine YELLOW (*)    APPearance CLEAR (*)    All other components within normal limits  CBC  TROPONIN I   ____________________________________________  EKG  ED ECG REPORT I, Rebecka Apley, the attending physician, personally viewed and interpreted this ECG.   Date:  02/25/2015  EKG Time: 0015  Rate: 70  Rhythm: normal sinus rhythm  Axis: normal  Intervals:none  ST&T Change: none  ____________________________________________  RADIOLOGY  CT head: No acute intracranial process, mild bilateral maxillary sinus disease likely inflammatory/allergic in nature. Bilateral elbow x-rays: Negative Right knee x-ray: Negative ____________________________________________   PROCEDURES  Procedure(s) performed: None  Critical Care performed: No  ____________________________________________   INITIAL IMPRESSION / ASSESSMENT AND PLAN / ED COURSE  Pertinent labs & imaging results that were available during my care of the patient were reviewed by me and considered in my medical decision making (see chart for details).  This is a 49 year old male who comes in today with seizures and dental pain. The patient when I initially arrived reports that the seizures have been but he is most concerned about his pain that he cannot control. The patient does have a history of chronic pain and reports that he has not been taking narcotics from his physician but he feels that he needs something stronger because he cannot tolerate his dental pain. I did inform the patient that our goal in the emergency Department is not to treat dental pain with narcotics but to treat infection and have him follow-up with the dentist. I informed the patient of [employer] clinic that he can follow up with if he is unable to wait for his appointment. I did give the patient some viscous lidocaine to swish in his mouth which she reports did not help his pain. I also give the patient a dose of tramadol and Toradol. I did receive the results of the patient's blood work as well as imaging which was unremarkable. I gave the patient a dose of Keppra 1 g IV and feel  that the patient needs to follow-up with neurology. When it went back into the room the patient reports it is not just his mouth hurting  it's also his knee where he fell as well as his elbows. I informed the patient that he did not initially inform me of the pain in his knee and elbows. I x-rayed the patient's extremities and they were also negative. The patient was very adamant about me giving him pain medication but I continued to encourage him that I will give him nonnarcotic medication for his dental pain and that he does need to follow up with the dentist. I also encourage the patient that should he need pain medicine he can see his primary care physician who can provide him with narcotic pain medicine since they already have a relationship. The patient reports that he does not want to ask his primary care physician for pain medication because of their relationship. I again informed the patient that we can treat his pain with other things but he does need to follow up with the dental clinic about his teeth. Once I received the results of the patient's x-rays he will be discharged to home to follow-up with [facility] dental clinic. The patient also received a liter of normal saline as his heart rate did increase from laying to standing. ____________________________________________   FINAL CLINICAL IMPRESSION(S) / ED DIAGNOSES  Final diagnoses:  Seizure (HCC)  Pain due to dental caries  Musculoskeletal pain  Syncope, unspecified syncope type      Rebecka Apley, MD 02/25/15 [phone]

## 2015-02-25 NOTE — ED Notes (Signed)
Pt remains in pain, Dr Zenda AlpersWebster notified.

## 2015-02-25 NOTE — Discharge Instructions (Signed)
Dental Caries °Dental caries (also called tooth decay) is the most common oral disease. It can occur at any age but is more common in children and young adults.  °HOW DENTAL CARIES DEVELOPS  °The process of decay begins when bacteria and foods (particularly sugars and starches) combine in your mouth to produce plaque. Plaque is a substance that sticks to the hard, outer surface of a tooth (enamel). The bacteria in plaque produce acids that attack enamel. These acids may also attack the root surface of a tooth (cementum) if it is exposed. Repeated attacks dissolve these surfaces and create holes in the tooth (cavities). If left untreated, the acids destroy the other layers of the tooth.  °RISK FACTORS °· Frequent sipping of sugary beverages.   °· Frequent snacking on sugary and starchy foods, especially those that easily get stuck in the teeth.   °· Poor oral hygiene.   °· Dry mouth.   °· Substance abuse such as methamphetamine abuse.   °· Broken or poor-fitting dental restorations.   °· Eating disorders.   °· Gastroesophageal reflux disease (GERD).   °· Certain radiation treatments to the head and neck. °SYMPTOMS °In the early stages of dental caries, symptoms are seldom present. Sometimes white, chalky areas may be seen on the enamel or other tooth layers. In later stages, symptoms may include: °· Pits and holes on the enamel. °· Toothache after sweet, hot, or cold foods or drinks are consumed. °· Pain around the tooth. °· Swelling around the tooth. °DIAGNOSIS  °Most of the time, dental caries is detected during a regular dental checkup. A diagnosis is made after a thorough medical and dental history is taken and the surfaces of your teeth are checked for signs of dental caries. Sometimes special instruments, such as lasers, are used to check for dental caries. Dental X-ray exams may be taken so that areas not visible to the eye (such as between the contact areas of the teeth) can be checked for cavities.    °TREATMENT  °If dental caries is in its early stages, it may be reversed with a fluoride treatment or an application of a remineralizing agent at the dental office. Thorough brushing and flossing at home is needed to aid these treatments. If it is in its later stages, treatment depends on the location and extent of tooth destruction:  °· If a small area of the tooth has been destroyed, the destroyed area will be removed and cavities will be filled with a material such as gold, silver amalgam, or composite resin.   °· If a large area of the tooth has been destroyed, the destroyed area will be removed and a cap (crown) will be fitted over the remaining tooth structure.   °· If the center part of the tooth (pulp) is affected, a procedure called a root canal will be needed before a filling or crown can be placed.   °· If most of the tooth has been destroyed, the tooth may need to be pulled (extracted). °HOME CARE INSTRUCTIONS °You can prevent, stop, or reverse dental caries at home by practicing good oral hygiene. Good oral hygiene includes: °· Thoroughly cleaning your teeth at least twice a day with a toothbrush and dental floss.   °· Using a fluoride toothpaste. A fluoride mouth rinse may also be used if recommended by your dentist or health care provider.   °· Restricting the amount of sugary and starchy foods and sugary liquids you consume.   °· Avoiding frequent snacking on these foods and sipping of these liquids.   °· Keeping regular visits with   a dentist for checkups and cleanings. PREVENTION   Practice good oral hygiene.  Consider a dental sealant. A dental sealant is a coating material that is applied by your dentist to the pits and grooves of teeth. The sealant prevents food from being trapped in them. It may protect the teeth for several years.  Ask about fluoride supplements if you live in a community without fluorinated water or with water that has a low fluoride content. Use fluoride supplements  as directed by your dentist or health care provider.  Allow fluoride varnish applications to teeth if directed by your dentist or health care provider.   This information is not intended to replace advice given to you by your health care provider. Make sure you discuss any questions you have with your health care provider.   Document Released: 12/04/2001 Document Revised: 04/04/2014 Document Reviewed: 03/16/2012 Elsevier Interactive Patient Education 2016 [employer]  Dental Pain Dental pain may be caused by many things, including:  Tooth decay (cavities or caries). Cavities expose the nerve of your tooth to air and hot or cold temperatures. This can cause pain or discomfort.  Abscess or infection. A dental abscess is a collection of infected pus from a bacterial infection in the inner part of the tooth (pulp). It usually occurs at the end of the tooth's root.  Injury.  An unknown reason (idiopathic). Your pain may be mild or severe. It may only occur when:  You are chewing.  You are exposed to hot or cold temperature.  You are eating or drinking sugary foods or beverages, such as soda or candy. Your pain may also be constant. HOME CARE INSTRUCTIONS Watch your dental pain for any changes. The following actions may help to lessen any discomfort that you are feeling:  Take medicines only as directed by your dentist.  If you were prescribed an antibiotic medicine, finish all of it even if you start to feel better.  Keep all follow-up visits as directed by your dentist. This is important.  Do not apply heat to the outside of your face.  Rinse your mouth or gargle with salt water if directed by your dentist. This helps with pain and swelling.  You can make salt water by adding  tsp of salt to 1 cup of warm water.  Apply ice to the painful area of your face:  Put ice in a plastic bag.  Place a towel between your skin and the bag.  Leave the ice on for 20 minutes, 2-3  times per day.  Avoid foods or drinks that cause you pain, such as:  Very hot or very cold foods or drinks.  Sweet or sugary foods or drinks. SEEK MEDICAL CARE IF:  Your pain is not controlled with medicines.  Your symptoms are worse.  You have new symptoms. SEEK IMMEDIATE MEDICAL CARE IF:  You are unable to open your mouth.  You are having trouble breathing or swallowing.  You have a fever.  Your face, neck, or jaw is swollen.   This information is not intended to replace advice given to you by your health care provider. Make sure you discuss any questions you have with your health care provider.   Document Released: 03/14/2005 Document Revised: 07/29/2014 Document Reviewed: 03/10/2014 Elsevier Interactive Patient Education 2016 [employer]  Musculoskeletal Pain Musculoskeletal pain is muscle and boney aches and pains. These pains can occur in any part of the body. Your caregiver may treat you without knowing the cause of the  pain. They may treat you if blood or urine tests, X-rays, and other tests were normal.  CAUSES There is often not a definite cause or reason for these pains. These pains may be caused by a type of germ (virus). The discomfort may also come from overuse. Overuse includes working out too hard when your body is not fit. Boney aches also come from weather changes. Bone is sensitive to atmospheric pressure changes. HOME CARE INSTRUCTIONS   Ask when your test results will be ready. Make sure you get your test results.  Only take over-the-counter or prescription medicines for pain, discomfort, or fever as directed by your caregiver. If you were given medications for your condition, do not drive, operate machinery or power tools, or sign legal documents for 24 hours. Do not drink alcohol. Do not take sleeping pills or other medications that may interfere with treatment.  Continue all activities unless the activities cause more pain. When the pain lessens,  slowly resume normal activities. Gradually increase the intensity and duration of the activities or exercise.  During periods of severe pain, bed rest may be helpful. Lay or sit in any position that is comfortable.  Putting ice on the injured area.  Put ice in a bag.  Place a towel between your skin and the bag.  Leave the ice on for 15 to 20 minutes, 3 to 4 times a day.  Follow up with your caregiver for continued problems and no reason can be found for the pain. If the pain becomes worse or does not go away, it may be necessary to repeat tests or do additional testing. Your caregiver may need to look further for a possible cause. SEEK IMMEDIATE MEDICAL CARE IF:  You have pain that is getting worse and is not relieved by medications.  You develop chest pain that is associated with shortness or breath, sweating, feeling sick to your stomach (nauseous), or throw up (vomit).  Your pain becomes localized to the abdomen.  You develop any new symptoms that seem different or that concern you. MAKE SURE YOU:   Understand these instructions.  Will watch your condition.  Will get help right away if you are not doing well or get worse.   This information is not intended to replace advice given to you by your health care provider. Make sure you discuss any questions you have with your health care provider.   Document Released: 03/14/2005 Document Revised: 06/06/2011 Document Reviewed: 11/16/2012 Elsevier Interactive Patient Education [employer]  Seizure, Adult A seizure is abnormal electrical activity in the brain. Seizures usually last from 30 seconds to 2 minutes. There are various types of seizures. Before a seizure, you may have a warning sensation (aura) that a seizure is about to occur. An aura may include the following symptoms:   Fear or anxiety.  Nausea.  Feeling like the room is spinning (vertigo).  Vision changes, such as seeing flashing lights or spots. Common  symptoms during a seizure include:  A change in attention or behavior (altered mental status).  Convulsions with rhythmic jerking movements.  Drooling.  Rapid eye movements.  Grunting.  Loss of bladder and bowel control.  Bitter taste in the mouth.  Tongue biting. After a seizure, you may feel confused and sleepy. You may also have an injury resulting from convulsions during the seizure. HOME CARE INSTRUCTIONS   If you are given medicines, take them exactly as prescribed by your health care provider.  Keep all follow-up appointments as  directed by your health care provider.  Do not swim or drive or engage in risky activity during which a seizure could cause further injury to you or others until your health care provider says it is OK.  Get adequate rest.  Teach friends and family what to do if you have a seizure. They should:  Lay you on the ground to prevent a fall.  Put a cushion under your head.  Loosen any tight clothing around your neck.  Turn you on your side. If vomiting occurs, this helps keep your airway clear.  Stay with you until you recover.  Know whether or not you need emergency care. SEEK IMMEDIATE MEDICAL CARE IF:  The seizure lasts longer than 5 minutes.  The seizure is severe or you do not wake up immediately after the seizure.  You have an altered mental status after the seizure.  You are having more frequent or worsening seizures. Someone should drive you to the emergency department or call local emergency services (911 in [place] MAKE SURE YOU:  Understand these instructions.  Will watch your condition.  Will get help right away if you are not doing well or get worse.   This information is not intended to replace advice given to you by your health care provider. Make sure you discuss any questions you have with your health care provider.   Document Released: 03/11/2000 Document Revised: 04/04/2014 Document Reviewed: 10/24/2012 Elsevier  Interactive Patient Education 2016 [employer].  Syncope Syncope is a medical term for fainting or passing out. This means you lose consciousness and drop to the ground. People are generally unconscious for less than 5 minutes. You may have some muscle twitches for up to 15 seconds before waking up and returning to normal. Syncope occurs more often in older adults, but it can happen to anyone. While most causes of syncope are not dangerous, syncope can be a sign of a serious medical problem. It is important to seek medical care.  CAUSES  Syncope is caused by a sudden drop in blood flow to the brain. The specific cause is often not determined. Factors that can bring on syncope include:  Taking medicines that lower blood pressure.  Sudden changes in posture, such as standing up quickly.  Taking more medicine than prescribed.  Standing in one place for too long.  Seizure disorders.  Dehydration and excessive exposure to heat.  Low blood sugar (hypoglycemia).  Straining to have a bowel movement.  Heart disease, irregular heartbeat, or other circulatory problems.  Fear, emotional distress, seeing blood, or severe pain. SYMPTOMS  Right before fainting, you may:  Feel dizzy or light-headed.  Feel nauseous.  See all white or all black in your field of vision.  Have cold, clammy skin. DIAGNOSIS  Your health care provider will ask about your symptoms, perform a physical exam, and perform an electrocardiogram (ECG) to record the electrical activity of your heart. Your health care provider may also perform other heart or blood tests to determine the cause of your syncope which may include:  Transthoracic echocardiogram (TTE). During echocardiography, sound waves are used to evaluate how blood flows through your heart.  Transesophageal echocardiogram (TEE).  Cardiac monitoring. This allows your health care provider to monitor your heart rate and rhythm in real time.  Holter monitor.  This is a portable device that records your heartbeat and can help diagnose heart arrhythmias. It allows your health care provider to track your heart activity for several days, if needed.  Stress tests by exercise or by giving medicine that makes the heart beat faster. TREATMENT  In most cases, no treatment is needed. Depending on the cause of your syncope, your health care provider may recommend changing or stopping some of your medicines. HOME CARE INSTRUCTIONS  Have someone stay with you until you feel stable.  Do not drive, use machinery, or play sports until your health care provider says it is okay.  Keep all follow-up appointments as directed by your health care provider.  Lie down right away if you start feeling like you might faint. Breathe deeply and steadily. Wait until all the symptoms have passed.  Drink enough fluids to keep your urine clear or pale yellow.  If you are taking blood pressure or heart medicine, get up slowly and take several minutes to sit and then stand. This can reduce dizziness. SEEK IMMEDIATE MEDICAL CARE IF:   You have a severe headache.  You have unusual pain in the chest, abdomen, or back.  You are bleeding from your mouth or rectum, or you have black or tarry stool.  You have an irregular or very fast heartbeat.  You have pain with breathing.  You have repeated fainting or seizure-like jerking during an episode.  You faint when sitting or lying down.  You have confusion.  You have trouble walking.  You have severe weakness.  You have vision problems. If you fainted, call your local emergency services (911 in [place] Do not drive yourself to the hospital.    This information is not intended to replace advice given to you by your health care provider. Make sure you discuss any questions you have with your health care provider.   Document Released: 03/14/2005 Document Revised: 07/29/2014 Document Reviewed: 05/13/2011 Elsevier Interactive  Patient Education [employer] Inc.

## 2015-02-25 NOTE — ED Notes (Signed)
Pt called out again for pain meds.  Dr Zenda AlpersWebster notified.

## 2015-04-09 ENCOUNTER — Emergency Department: Payer: Medicaid Other

## 2015-04-09 ENCOUNTER — Observation Stay
Admission: EM | Admit: 2015-04-09 | Discharge: 2015-04-10 | Disposition: A | Discharge status: Home / Self Care | Payer: Medicaid Other | Attending: Internal Medicine | Admitting: Internal Medicine

## 2015-04-09 DIAGNOSIS — Z79899 Other long term (current) drug therapy: Secondary | ICD-10-CM | POA: Insufficient documentation

## 2015-04-09 DIAGNOSIS — E669 Obesity, unspecified: Secondary | ICD-10-CM

## 2015-04-09 DIAGNOSIS — F419 Anxiety disorder, unspecified: Secondary | ICD-10-CM | POA: Diagnosis not present

## 2015-04-09 DIAGNOSIS — Z87898 Personal history of other specified conditions: Secondary | ICD-10-CM | POA: Diagnosis not present

## 2015-04-09 DIAGNOSIS — Z8673 Personal history of transient ischemic attack (TIA), and cerebral infarction without residual deficits: Secondary | ICD-10-CM | POA: Diagnosis not present

## 2015-04-09 DIAGNOSIS — G8929 Other chronic pain: Secondary | ICD-10-CM | POA: Insufficient documentation

## 2015-04-09 DIAGNOSIS — E871 Hypo-osmolality and hyponatremia: Secondary | ICD-10-CM | POA: Diagnosis not present

## 2015-04-09 DIAGNOSIS — R531 Weakness: Secondary | ICD-10-CM | POA: Insufficient documentation

## 2015-04-09 DIAGNOSIS — K219 Gastro-esophageal reflux disease without esophagitis: Secondary | ICD-10-CM | POA: Diagnosis not present

## 2015-04-09 DIAGNOSIS — F4481 Dissociative identity disorder: Secondary | ICD-10-CM | POA: Insufficient documentation

## 2015-04-09 DIAGNOSIS — Z6829 Body mass index (BMI) 29.0-29.9, adult: Secondary | ICD-10-CM | POA: Diagnosis not present

## 2015-04-09 DIAGNOSIS — R441 Visual hallucinations: Secondary | ICD-10-CM | POA: Diagnosis not present

## 2015-04-09 DIAGNOSIS — Z79891 Long term (current) use of opiate analgesic: Secondary | ICD-10-CM | POA: Diagnosis not present

## 2015-04-09 DIAGNOSIS — F429 Obsessive-compulsive disorder, unspecified: Secondary | ICD-10-CM | POA: Insufficient documentation

## 2015-04-09 DIAGNOSIS — M1991 Primary osteoarthritis, unspecified site: Secondary | ICD-10-CM | POA: Diagnosis not present

## 2015-04-09 DIAGNOSIS — Z791 Long term (current) use of non-steroidal anti-inflammatories (NSAID): Secondary | ICD-10-CM | POA: Insufficient documentation

## 2015-04-09 DIAGNOSIS — J449 Chronic obstructive pulmonary disease, unspecified: Secondary | ICD-10-CM | POA: Insufficient documentation

## 2015-04-09 DIAGNOSIS — F319 Bipolar disorder, unspecified: Secondary | ICD-10-CM | POA: Insufficient documentation

## 2015-04-09 DIAGNOSIS — D72829 Elevated white blood cell count, unspecified: Secondary | ICD-10-CM

## 2015-04-09 DIAGNOSIS — I252 Old myocardial infarction: Secondary | ICD-10-CM | POA: Insufficient documentation

## 2015-04-09 DIAGNOSIS — R079 Chest pain, unspecified: Secondary | ICD-10-CM | POA: Insufficient documentation

## 2015-04-09 DIAGNOSIS — R32 Unspecified urinary incontinence: Secondary | ICD-10-CM | POA: Insufficient documentation

## 2015-04-09 DIAGNOSIS — F17200 Nicotine dependence, unspecified, uncomplicated: Secondary | ICD-10-CM | POA: Diagnosis not present

## 2015-04-09 DIAGNOSIS — R51 Headache: Secondary | ICD-10-CM | POA: Diagnosis not present

## 2015-04-09 DIAGNOSIS — G4700 Insomnia, unspecified: Secondary | ICD-10-CM

## 2015-04-09 DIAGNOSIS — M549 Dorsalgia, unspecified: Secondary | ICD-10-CM | POA: Diagnosis not present

## 2015-04-09 DIAGNOSIS — R569 Unspecified convulsions: Secondary | ICD-10-CM | POA: Diagnosis present

## 2015-04-09 DIAGNOSIS — F4310 Post-traumatic stress disorder, unspecified: Secondary | ICD-10-CM | POA: Diagnosis not present

## 2015-04-09 LAB — CBC
HCT: 43.3 % (ref 40.0–52.0)
HEMOGLOBIN: 14.5 g/dL (ref 13.0–18.0)
MCH: 29.1 pg (ref 26.0–34.0)
MCHC: 33.5 g/dL (ref 32.0–36.0)
MCV: 86.7 fL (ref 80.0–100.0)
PLATELETS: 207 10*3/uL (ref 150–440)
RBC: 5 MIL/uL (ref 4.40–5.90)
RDW: 13.1 % (ref 11.5–14.5)
WBC: 20.1 10*3/uL — ABNORMAL HIGH (ref 3.8–10.6)

## 2015-04-09 LAB — GLUCOSE, CAPILLARY: Glucose-Capillary: 114 mg/dL — ABNORMAL HIGH (ref 65–99)

## 2015-04-09 NOTE — ED Notes (Signed)
Patient transported to CT 

## 2015-04-09 NOTE — ED Notes (Signed)
Pt to ED via [facility] c/o burning chest pain and right arm heaviness. Pt also states that bilateral legs are weak and he has back pain when he gets up. Pt states that all of this is new since earlier today, unsure of what time. Pt had seizure earlier today, EMS came to house and pt refused transport back to ER; pt states that he is unsure what time he had a seizure. Pt hx of seizures, does not think that he takes any medications daily for seizure.

## 2015-04-10 ENCOUNTER — Observation Stay: Payer: Medicaid Other

## 2015-04-10 ENCOUNTER — Encounter: Payer: Self-pay | Admitting: Internal Medicine

## 2015-04-10 DIAGNOSIS — E871 Hypo-osmolality and hyponatremia: Secondary | ICD-10-CM

## 2015-04-10 DIAGNOSIS — G4700 Insomnia, unspecified: Secondary | ICD-10-CM

## 2015-04-10 DIAGNOSIS — E669 Obesity, unspecified: Secondary | ICD-10-CM

## 2015-04-10 DIAGNOSIS — Z87898 Personal history of other specified conditions: Secondary | ICD-10-CM

## 2015-04-10 DIAGNOSIS — D72829 Elevated white blood cell count, unspecified: Secondary | ICD-10-CM

## 2015-04-10 LAB — BASIC METABOLIC PANEL
ANION GAP: 6 (ref 5–15)
ANION GAP: 7 (ref 5–15)
BUN: 13 mg/dL (ref 6–20)
BUN: 16 mg/dL (ref 6–20)
CHLORIDE: 105 mmol/L (ref 101–111)
CO2: 22 mmol/L (ref 22–32)
CO2: 23 mmol/L (ref 22–32)
CREATININE: 1.11 mg/dL (ref 0.61–1.24)
Calcium: 8.7 mg/dL — ABNORMAL LOW (ref 8.9–10.3)
Calcium: 9.1 mg/dL (ref 8.9–10.3)
Chloride: 108 mmol/L (ref 101–111)
Creatinine, Ser: 0.84 mg/dL (ref 0.61–1.24)
GFR calc non Af Amer: >60 mL/min (ref >60)
GLUCOSE: 92 mg/dL (ref 65–99)
Glucose, Bld: 125 mg/dL — ABNORMAL HIGH (ref 65–99)
POTASSIUM: 3.5 mmol/L (ref 3.5–5.1)
Potassium: 3.5 mmol/L (ref 3.5–5.1)
Sodium: 134 mmol/L — ABNORMAL LOW (ref 135–145)
Sodium: 137 mmol/L (ref 135–145)

## 2015-04-10 LAB — URINALYSIS COMPLETE WITH MICROSCOPIC (ARMC ONLY)
BILIRUBIN URINE: NEGATIVE
GLUCOSE, UA: NEGATIVE mg/dL
HGB URINE DIPSTICK: NEGATIVE
KETONES UR: NEGATIVE mg/dL
LEUKOCYTES UA: NEGATIVE
NITRITE: NEGATIVE
Protein, ur: NEGATIVE mg/dL
SPECIFIC GRAVITY, URINE: 1.024 (ref 1.005–1.030)
Squamous Epithelial / LPF: NONE SEEN
pH: 5 (ref 5.0–8.0)

## 2015-04-10 LAB — CBC
HEMATOCRIT: 41.1 % (ref 40.0–52.0)
HEMOGLOBIN: 13.8 g/dL (ref 13.0–18.0)
MCH: 29.7 pg (ref 26.0–34.0)
MCHC: 33.4 g/dL (ref 32.0–36.0)
MCV: 88.8 fL (ref 80.0–100.0)
Platelets: 191 10*3/uL (ref 150–440)
RBC: 4.63 MIL/uL (ref 4.40–5.90)
RDW: 13.4 % (ref 11.5–14.5)
WBC: 17.1 10*3/uL — ABNORMAL HIGH (ref 3.8–10.6)

## 2015-04-10 LAB — TROPONIN I: Troponin I: <0.03 ng/mL (ref <0.031)

## 2015-04-10 LAB — CK: Total CK: 380 U/L (ref 49–397)

## 2015-04-10 MED ORDER — ONDANSETRON HCL 4 MG/2ML IJ SOLN
4.0000 mg | Freq: Once | INTRAMUSCULAR | Status: AC
  Administered 2015-04-10: 4 mg via INTRAVENOUS
  Filled 2015-04-10: qty 2

## 2015-04-10 MED ORDER — LEVETIRACETAM 500 MG PO TABS: 500.0000 mg | ORAL_TABLET | Freq: Two times a day (BID) | ORAL | Status: DC

## 2015-04-10 MED ORDER — SODIUM CHLORIDE 0.9 % IV SOLN
1000.0000 mg | Freq: Once | INTRAVENOUS | Status: AC
  Administered 2015-04-10: 1000 mg via INTRAVENOUS
  Filled 2015-04-10: qty 10

## 2015-04-10 MED ORDER — ALPRAZOLAM 0.5 MG PO TABS
1.0000 mg | ORAL_TABLET | Freq: Once | ORAL | Status: AC
Start: 2015-04-10 — End: 2015-04-10
  Administered 2015-04-10: 1 mg via ORAL
  Filled 2015-04-10: qty 2

## 2015-04-10 MED ORDER — ONDANSETRON HCL 4 MG/2ML IJ SOLN
4.0000 mg | Freq: Four times a day (QID) | INTRAMUSCULAR | Status: DC | PRN
Start: 2015-04-10 — End: 2015-04-10

## 2015-04-10 MED ORDER — ACETAMINOPHEN 325 MG PO TABS: 650.0000 mg | ORAL_TABLET | Freq: Four times a day (QID) | ORAL | Status: DC | PRN

## 2015-04-10 MED ORDER — HEPARIN SODIUM (PORCINE) 5000 UNIT/ML IJ SOLN
5000.0000 [IU] | Freq: Three times a day (TID) | INTRAMUSCULAR | Status: DC
  Administered 2015-04-10 (×2): 5000 [IU] via SUBCUTANEOUS
  Filled 2015-04-10 (×2): qty 1

## 2015-04-10 MED ORDER — OXYCODONE HCL 5 MG PO TABS
5.0000 mg | ORAL_TABLET | ORAL | Status: DC | PRN
  Administered 2015-04-10 (×3): 5 mg via ORAL
  Filled 2015-04-10 (×3): qty 1

## 2015-04-10 MED ORDER — SODIUM CHLORIDE 0.9 % IV BOLUS (SEPSIS)
1000.0000 mL | Freq: Once | INTRAVENOUS | Status: AC
  Administered 2015-04-10: 1000 mL via INTRAVENOUS

## 2015-04-10 MED ORDER — SODIUM CHLORIDE 0.9 % IV SOLN
INTRAVENOUS | Status: DC
  Administered 2015-04-10: 04:00:00 via INTRAVENOUS

## 2015-04-10 MED ORDER — LEVETIRACETAM 500 MG PO TABS
500.0000 mg | ORAL_TABLET | Freq: Two times a day (BID) | ORAL | Status: DC
  Administered 2015-04-10: 16:00:00 500 mg via ORAL
  Filled 2015-04-10: qty 1

## 2015-04-10 MED ORDER — MORPHINE SULFATE (PF) 4 MG/ML IV SOLN
4.0000 mg | Freq: Once | INTRAVENOUS | Status: AC
  Administered 2015-04-10: 4 mg via INTRAVENOUS
  Filled 2015-04-10: qty 1

## 2015-04-10 MED ORDER — ALPRAZOLAM 1 MG PO TABS
1.0000 mg | ORAL_TABLET | Freq: Three times a day (TID) | ORAL | Status: DC | PRN
  Administered 2015-04-10: 1 mg via ORAL
  Filled 2015-04-10: qty 1

## 2015-04-10 MED ORDER — ONDANSETRON HCL 4 MG PO TABS: 4.0000 mg | ORAL_TABLET | Freq: Four times a day (QID) | ORAL | Status: DC | PRN

## 2015-04-10 MED ORDER — ACETAMINOPHEN 650 MG RE SUPP: 650.0000 mg | Freq: Four times a day (QID) | RECTAL | Status: DC | PRN

## 2015-04-10 MED ORDER — ENOXAPARIN SODIUM 40 MG/0.4ML ~~LOC~~ SOLN: 40.0000 mg | SUBCUTANEOUS | Status: DC

## 2015-04-10 MED ORDER — LORAZEPAM 2 MG/ML IJ SOLN
1.0000 mg | Freq: Once | INTRAMUSCULAR | Status: AC
  Administered 2015-04-10: 1 mg via INTRAVENOUS
  Filled 2015-04-10: qty 1

## 2015-04-10 MED ORDER — MORPHINE SULFATE (PF) 2 MG/ML IV SOLN
2.0000 mg | INTRAVENOUS | Status: DC | PRN
  Administered 2015-04-10 (×2): 2 mg via INTRAVENOUS
  Filled 2015-04-10 (×2): qty 1

## 2015-04-10 NOTE — Plan of Care (Signed)
Problem: Self-Concept: Goal: Level of anxiety will decrease Outcome: Progressing Pt report pain x1. No seizure activity since admission. Pt report numbness in right arm and weakness in bilateral legs. No other signs of distress noted. Will continue to monitor.

## 2015-04-10 NOTE — Progress Notes (Signed)
Dc instructions given to pt. Focus on importance of getting Keppra filled tonight and taking med at [facility] tomorrow. Doing stagger doses to get to 8am/8pm dosing, also explained the therapeutic nature of Keppra and the importance of not having dips in the levels. pt verbalizes clear understanding with teach back method. [facility], and to call pcp Monday for follow up. Pt ready for DC. [facility] and to dc home

## 2015-04-10 NOTE — Discharge Summary (Signed)
[facility] [place] at [facility]   PATIENT NAME: Vincent Dennis    MR#:  [id number]  DATE OF BIRTH:  20-Mar-1966  DATE OF ADMISSION:  04/09/2015 ADMITTING PHYSICIAN: Wyatt Haste, MD  DATE OF DISCHARGE: No discharge date for patient encounter.  PRIMARY CARE PHYSICIAN: Jearld Lesch, MD     ADMISSION DIAGNOSIS:  Seizure [facility]) [R56.9]  DISCHARGE DIAGNOSIS:  Principal Problem:   Seizure (HCC) Active Problems:   Hyponatremia   Leukocytosis   Insomnia   Posttraumatic stress disorder   Obesity   SECONDARY DIAGNOSIS:   Past Medical History  Diagnosis Date  . Chronic back pain   . Myocardial infarction (HCC)   . Angina   . COPD (chronic obstructive pulmonary disease) (HCC)   . Shortness of breath   . Recurrent upper respiratory infection (URI)   . GERD (gastroesophageal reflux disease)   . H/O hiatal hernia   . Headache(784.0)   . Arthritis   . Mental disorder   . Anxiety   . Depression   . Seizures (HCC)   . Migraines   . TIA (transient ischemic attack)   . Emphysema of lung (HCC)   . Heart attack (HCC)     .pro HOSPITAL COURSE:  Vincent Dennis is a 50 y.o. male with a known history of GERD without esophagitis, prior seizures presenting with seizure activity. Apparently had 2 seizure episodes,  no tongue biting,  positive urinary incontinence,  positive post ictal state. He states he was not concerned  initially because this was "about his 10th"seizure,  however, an hour later he had a second seizure episode.  This concerned him enough that he presented to the Hospital for further workup and evaluation. He states that he did have some preceding aura he perceives seeing flashing lights. Fortunately he was back to his baseline. Despite having a multitude of seizures in the past he had no formal workup, evaluation, was not on medications. He was admitted to the hospital, had MRI of brain which was normal, EEG could not have been done since it was  unavailable today, I did discuss the case with neurologist DR Thad Ranger over the phone, who recommended to initiate patient on Keppra after loading dose and discharge him home. Since patient was complaining of insomnia, "posttraumatic stress disorder " which he diagnosed himself with, psychiatrist consult was obtained, but no medical therapy was recommended, but [facility] in [place], [place].  Discussion by problem: 1. seizure d/o, initiate on Keppra, no EEG needed, by neurologist, MRI of brain was normal, dc patient home with PCP follow up 2. Hyponatremia, resolved with IVF 3. Leukocytosis, no infection noted, likely stress, resolving with no antibiotics, follow as outpatient 4. Insomnia, refused to consider being weaned off Xanax 5. History of opiate abuse, no UDS performed, patient is to follow up with [facility] [place], [place]   DISCHARGE CONDITIONS:   stable  CONSULTS OBTAINED:  Treatment Team:  Audery Amel, MD  DRUG ALLERGIES:  No Known Allergies  DISCHARGE MEDICATIONS:   Current Discharge Medication List    CONTINUE these medications which have CHANGED   Details  levETIRAcetam (KEPPRA) 500 MG tablet Take 1 tablet (500 mg total) by mouth 2 (two) times daily. Qty: 60 tablet, Refills: 6      CONTINUE these medications which have NOT CHANGED   Details  albuterol (PROVENTIL HFA;VENTOLIN HFA) 108 (90 BASE) MCG/ACT inhaler Inhale 1 puff into the lungs every 6 (six) hours as needed for wheezing or  shortness of breath.    ALPRAZolam (XANAX) 1 MG tablet Take 1 tablet (1 mg total) by mouth 3 (three) times daily as needed for anxiety. Qty: 15 tablet, Refills: 0    Diphenhydramine-APAP, sleep, (GOODYS PM PO) Take 1 packet by mouth at bedtime as needed (for sleep).    oxyCODONE-acetaminophen (PERCOCET/ROXICET) 5-325 MG per tablet Take 1 tablet by mouth every 6 (six) hours as needed for severe pain. Qty: 15 tablet, Refills: 0    cloNIDine (CATAPRES) 0.1 MG tablet Take 1 tablet (0.1 mg total)  by mouth 3 (three) times daily as needed (opioid withdrawl symptoms). Qty: 30 tablet, Refills: 0    cyclobenzaprine (FLEXERIL) 10 MG tablet Take 1 tablet (10 mg total) by mouth 2 (two) times daily as needed for muscle spasms. Qty: 20 tablet, Refills: 0    etodolac (LODINE) 200 MG capsule Take 1 capsule (200 mg total) by mouth every 8 (eight) hours. Qty: 12 capsule, Refills: 0    HYDROcodone-acetaminophen (NORCO/VICODIN) 5-325 MG per tablet Take 1-2 tablets by mouth every 6 (six) hours as needed for moderate pain or severe pain. Qty: 15 tablet, Refills: 0    ibuprofen (ADVIL,MOTRIN) 800 MG tablet Take 1 tablet (800 mg total) by mouth every 8 (eight) hours as needed. Qty: 30 tablet, Refills: 0    naproxen (NAPROSYN) 375 MG tablet Take 1 tablet (375 mg total) by mouth 2 (two) times daily. Qty: 20 tablet, Refills: 0    penicillin v potassium (VEETID) 500 MG tablet Take 1 tablet (500 mg total) by mouth 3 (three) times daily. Qty: 30 tablet, Refills: 0         DISCHARGE INSTRUCTIONS:    Patient is to follow up with PCP, [facility], neurology as needed  If you experience worsening of your admission symptoms, develop shortness of breath, life threatening emergency, suicidal or homicidal thoughts you must seek medical attention immediately by calling 911 or calling your MD immediately  if symptoms less severe.  You Must read complete instructions/literature along with all the possible adverse reactions/side effects for all the Medicines you take and that have been prescribed to you. Take any new Medicines after you have completely understood and accept all the possible adverse reactions/side effects.   Please note  You were cared for by a hospitalist during your hospital stay. If you have any questions about your discharge medications or the care you received while you were in the hospital after you are discharged, you can call the unit and asked to speak with the hospitalist on call if the  hospitalist that took care of you is not available. Once you are discharged, your primary care physician will handle any further medical issues. Please note that NO REFILLS for any discharge medications will be authorized once you are discharged, as it is imperative that you return to your primary care physician (or establish a relationship with a primary care physician if you do not have one) for your aftercare needs so that they can reassess your need for medications and monitor your lab values.    Today   CHIEF COMPLAINT:   Chief Complaint  Patient presents with  . Chest Pain  . Weakness    HISTORY OF PRESENT ILLNESS:  Vincent Dennis  is a 50 y.o. male with a known history of GERD without esophagitis, prior seizures presenting with seizure activity. Apparently had 2 seizure episodes,  no tongue biting,  positive urinary incontinence,  positive post ictal state. He states he was not concerned  initially because this was "about his 10th"seizure,  however, an hour later he had a second seizure episode.  This concerned him enough that he presented to the Hospital for further workup and evaluation. He states that he did have some preceding aura he perceives seeing flashing lights. Fortunately he was back to his baseline. Despite having a multitude of seizures in the past he had no formal workup, evaluation, was not on medications. He was admitted to the hospital, had MRI of brain which was normal, EEG could not have been done since it was unavailable today, I did discuss the case with neurologist DR Thad Rangereynolds over the phone, who recommended to initiate patient on Keppra after loading dose and discharge him home. Since patient was complaining of insomnia, "posttraumatic stress disorder " which he diagnosed himself with, psychiatrist consult was obtained, but no medical therapy was recommended, but [facility] in [place].  Discussion by problem: 1. seizure d/o, initiate on Keppra, no EEG needed, by  neurologist, MRI of brain was normal, dc patient home with PCP follow up 2. Hyponatremia, resolved with IVF 3. Leukocytosis, no infection noted, likely stress, resolving with no antibiotics, follow as outpatient 4. Insomnia, refused to consider being weaned off Xanax 5. History of opiate abuse, no UDS performed, patient is to follow up with [facility] [place],      VITAL SIGNS:  Blood pressure 129/73, pulse 64, temperature 97.8 F (36.6 C), temperature source Oral, resp. rate 18, height 6\' 2"  (1.88 m), weight 105.779 kg (233 lb 3.2 oz), SpO2 97 %.  I/O:   Intake/Output Summary (Last 24 hours) at 04/10/15 1922 Last data filed at 04/10/15 1800  Gross per 24 hour  Intake    720 ml  Output    850 ml  Net   -130 ml    PHYSICAL EXAMINATION:  GENERAL:  50 y.o.-year-old patient lying in the bed with no acute distress.  EYES: Pupils equal, round, reactive to light and accommodation. No scleral icterus. Extraocular muscles intact.  HEENT: Head atraumatic, normocephalic. Oropharynx and nasopharynx clear.  NECK:  Supple, no jugular venous distention. No thyroid enlargement, no tenderness.  LUNGS: Normal breath sounds bilaterally, no wheezing, rales,rhonchi or crepitation. No use of accessory muscles of respiration.  CARDIOVASCULAR: S1, S2 normal. No murmurs, rubs, or gallops.  ABDOMEN: Soft, non-tender, non-distended. Bowel sounds present. No organomegaly or mass.  EXTREMITIES: No pedal edema, cyanosis, or clubbing.  NEUROLOGIC: Cranial nerves II through XII are intact. Muscle strength 5/5 in all extremities. Sensation intact. Gait not checked.  PSYCHIATRIC: The patient is alert and oriented x 3.  SKIN: No obvious rash, lesion, or ulcer.   DATA REVIEW:   CBC  Recent Labs Lab 04/10/15 0425  WBC 17.1*  HGB 13.8  HCT 41.1  PLT 191    Chemistries   Recent Labs Lab 04/10/15 0425  NA 137  K 3.5  CL 108  CO2 22  GLUCOSE 92  BUN 13  CREATININE 0.84  CALCIUM 8.7*     Cardiac Enzymes  Recent Labs Lab 04/09/15 2344  TROPONINI <0.03    Microbiology Results  Results for orders placed or performed during the hospital encounter of 08/22/10  Urine culture     Status: None   Collection Time: 08/22/10  4:04 PM  Result Value Ref Range Status   Specimen Description URINE, CLEAN CATCH  Final   Special Requests NONE  Final   Culture  Setup Time [id number]  Final   Colony Count 5,000 COLONIES/ML  Final   Culture INSIGNIFICANT GROWTH  Final   Report Status 08/23/2010 FINAL  Final    RADIOLOGY:  Ct Head Wo Contrast  04/10/2015  CLINICAL DATA:  Weakness. Right arm heaviness. Seizure earlier today. EXAM: CT HEAD WITHOUT CONTRAST TECHNIQUE: Contiguous axial images were obtained from the base of the skull through the vertex without intravenous contrast. COMPARISON:  Most recent CT 02/25/2015 FINDINGS: No intracranial hemorrhage, mass effect, or midline shift. No hydrocephalus. The basilar cisterns are patent. No evidence of territorial infarct. No intracranial fluid collection. Calvarium is intact. Included paranasal sinuses and mastoid air cells are well aerated. IMPRESSION: No acute intracranial abnormality. Electronically Signed   By: Rubye Oaks M.D.   On: 04/10/2015 01:08   Mr Brain Wo Contrast  04/10/2015  CLINICAL DATA:  Recurrent seizure activity. EXAM: MRI HEAD WITHOUT CONTRAST TECHNIQUE: Multiplanar, multiecho pulse sequences of the brain and surrounding structures were obtained without intravenous contrast. COMPARISON:  CT head without contrast 04/09/2015. MRI brain 08/18/2011. FINDINGS: The study is again mildly degraded by patient motion. No acute infarct, hemorrhage, or mass lesion is present. The ventricles are of normal size. No significant extraaxial fluid collection is present. The hippocampal structures are symmetric in size and signal. The temporal lobes are unremarkable. The brainstem and cerebellum are within normal limits. Flow is  present in the major intracranial arteries. The globes and orbits are within normal limits. Mild mucosal thickening is present in the inferior left maxillary sinus. The paranasal sinuses are otherwise clear. There is some fluid in the right mastoid air cells. IMPRESSION: 1. Normal MRI appearance of the brain. No acute or focal lesion to explain seizures. 2. Mild left maxillary sinus and right mastoid disease. Electronically Signed   By: Marin Roberts M.D.   On: 04/10/2015 10:01    EKG:   Orders placed or performed during the hospital encounter of 04/09/15  . ED EKG  . ED EKG  . EKG 12-Lead  . EKG 12-Lead  . EKG 12-Lead  . EKG 12-Lead      Management plans discussed with the patient, family and they are in agreement.  CODE STATUS:     Code Status Orders        Start     Ordered   04/10/15 0232  Full code   Continuous     04/10/15 0235    Code Status History    Date Active Date Inactive Code Status Order ID Comments User Context   02/12/2014  2:19 AM 02/12/2014  7:46 AM Full Code [id number]  Antony Madura, PA-C ED     TIME TAKING CARE OF THIS PATIENT: 40 minutes.  Discussed with neurology, , dc planning -instructions to nursing staff, phone communication  With psychiatrist DR Maryruth Bun about [facility] referral, etc,time spent  about 15 additional minutes  Elyon Zoll M.D on 04/10/2015 at 7:22 PM  Between 7am to 6pm - Pager - [phone]  After 6pm go to [email or web address] - password [email or web address] [facility]  [facility]  Office  [phone]  CC: Primary care physician; Jearld Lesch, MD

## 2015-04-10 NOTE — Consult Note (Signed)
[facility] Face-to-Face Psychiatry Consult   Reason for Consult:  Consult for this 50 year old man currently in the hospital for evaluation of a reported seizure. Consult phrased as being for stress and sleep problems Referring Physician:  Judeen Hammans Patient Identification: Vincent Dennis MRN:  [id number] Principal Diagnosis: insomnia Diagnosis:   Patient Active Problem List   Diagnosis Date Noted  . Seizure ([place]) [R56.9] 04/10/2015  . Hyponatremia [E87.1] 04/10/2015  . Leukocytosis [D72.829] 04/10/2015  . Obesity [E66.9] 04/10/2015  . Insomnia [G47.00] 04/10/2015  . Posttraumatic stress disorder [F43.10] 04/10/2015  . Weakness of right side of body [M62.89] 08/19/2011  . Tobacco use disorder [F17.200] 08/19/2011  . COPD (chronic obstructive pulmonary disease) ([place]) [J44.9] 08/16/2011  . Back pain, chronic [M54.9, G89.29] 08/16/2011  . Headache(784.0) [R51] 08/16/2011  . History of MI (myocardial infarction) [I25.2] 08/16/2011  . Anxiety [F41.9] 08/16/2011    Total Time spent with patient: 1 hour  Subjective:   Vincent Dennis is a 50 y.o. male patient admitted with "I think that my problem is that the sleep is just catching up with me".  HPI:  Patient interviewed. Chart reviewed. Old notes reviewed as well as current labs and intake. This is a 50 year old man who presented to the hospital with reports that he had had 2 episodes considered to possibly be seizures that occurred yesterday. Patient states that he has had seizures previously but only about 10 or 12 times total in his life. He cannot think of any specific reason why he might of been more prone to having 2 of them yesterday. His there he is that the seizures are somehow related to his chronic sleep problem which is the main symptom he wants to speak to me about. Patient says that he has had problems with sleep for about 13 years. He dates it back to when his father passed away. He claims that since that time he has chronically been unable  to sleep about 5 out of every 7 nights of the week. He claims that he will go days at a time and has done so constantly without getting any sleep at all with only getting a few minutes of sleep. Despite this he does not sleep during the day and remains active throughout the day. Apparently he has in the past been prescribed sleeping medicines by his primary care doctor but says that his primary care doctor discontinued those 4 years ago because he thought the patient was taking too many sedatives. Since then the sleep problem has been even worse. Patient states that his mood during the day is mostly okay. Denies being depressed. Denies any suicidal ideation. He gives a vague report of visual hallucinations before the seizures but otherwise denies having routine hallucinations. Patient admits that he uses marijuana intermittently denies alcohol use. Admits to the correct dosages of his Xanax 1 mg 4 times a day and oxycodone 10 mg 3 times a day. He says that he has tried to cut back on the amount of oxycodone he takes because he thinks it's unnecessary. Patient is not currently seeing anyone for outpatient mental healthcare. He indicates that he gets all of his outpatient treatment through his primary care doctor.  Social history: Lives with his wife and a adolescent son at home. Patient is on long-term disability. Says that he does things around the house during the day and mostly stays active but is not working.  Medical history: Patient has a history of chronic back pain has apparently had several back  surgeries. History of myocardial infarction in the past. He is on chronic narcotic pain medicine and has been for a long time. Patient has had a documented multiple times over the years that he has presented with these seizures or seizure-like activity. There've also been documented incidents where he is presented with other neurologic symptoms that have been judged to be nonphysiologic  Substance abuse  history: Says that he smokes marijuana occasionally. Did not press him for the actual time course. Denies that he uses alcohol. Denies that he abuses or overuses his prescription medicine.  Past Psychiatric History: Patient's past psychiatric history he claims that he's had 1 prior psychiatric hospitalization about 15 or more years ago at [facility]. His description of it is strange and remarkably dramatic. He says he has never tried to kill himself in the past. He claims that in the past he had been diagnosed with OCD, bipolar disorder and multiple personality disorder. Despite this apparently he has not gotten any kind of mental health treatment in the last decade or so he indicates that he just relies on his primary care doctor. We do have some documentation of behavioral health evaluation in our old chart but it is all related to the patient requesting substance abuse treatment for his use of narcotics and benzodiazepines. Doesn't appear that he is ever followed through with actually trying to engage in substance abuse treatment around those. It's very clear from talking to him today that he has no interest in stopping the Xanax.  Risk to Self: Is patient at risk for suicide?: No Risk to Others:   Prior Inpatient Therapy:   Prior Outpatient Therapy:    Past Medical History:  Past Medical History  Diagnosis Date  . Chronic back pain   . Myocardial infarction ([place])   . Angina   . COPD (chronic obstructive pulmonary disease) ([place])   . Shortness of breath   . Recurrent upper respiratory infection (URI)   . GERD (gastroesophageal reflux disease)   . H/O hiatal hernia   . Headache(784.0)   . Arthritis   . Mental disorder   . Anxiety   . Depression   . Seizures ([place])   . Migraines   . TIA (transient ischemic attack)   . Emphysema of lung ([place])   . Heart attack [facility])     Past Surgical History  Procedure Laterality Date  . Back surgery     Family History:  Family History   Problem Relation Age of Onset  . Diabetes Neg Hx    Family Psychiatric  History: And states that he has a brother who is "nutty". Says he has another brother who might have mental health problems as well. Social History:  History  Alcohol Use No     History  Drug Use  . Yes  . Special: Marijuana    Social History   Social History  . Marital Status: Legally Separated    Spouse Name: N/A  . Number of Children: N/A  . Years of Education: N/A   Social History Main Topics  . Smoking status: Current Every Day Smoker -- 0.00 packs/day for 31 years  . Smokeless tobacco: None  . Alcohol Use: No  . Drug Use: Yes    Special: Marijuana  . Sexual Activity: Not Asked     Comment: narcotics, xanax   Other Topics Concern  . None   Social History Narrative   Additional Social History:  Allergies:  No Known Allergies  Labs:  Results for orders placed or performed during the hospital encounter of 04/09/15 (from the past 48 hour(s))  Basic metabolic panel     Status: Abnormal   Collection Time: 04/09/15 11:44 PM  Result Value Ref Range   Sodium 134 (L) 135 - 145 mmol/L   Potassium 3.5 3.5 - 5.1 mmol/L   Chloride 105 101 - 111 mmol/L   CO2 23 22 - 32 mmol/L   Glucose, Bld 125 (H) 65 - 99 mg/dL   BUN 16 6 - 20 mg/dL   Creatinine, Ser 1.11 0.61 - 1.24 mg/dL   Calcium 9.1 8.9 - 10.3 mg/dL   GFR calc non Af Amer >60 >60 mL/min   GFR calc Af Amer >60 >60 mL/min    Comment: (NOTE) The eGFR has been calculated using the CKD EPI equation. This calculation has not been validated in all clinical situations. eGFR's persistently <60 mL/min signify possible Chronic Kidney Disease.    Anion gap 6 5 - 15  CBC     Status: Abnormal   Collection Time: 04/09/15 11:44 PM  Result Value Ref Range   WBC 20.1 (H) 3.8 - 10.6 K/uL   RBC 5.00 4.40 - 5.90 MIL/uL   Hemoglobin 14.5 13.0 - 18.0 g/dL   HCT 43.3 40.0 - 52.0 %   MCV 86.7 80.0 - 100.0 fL   MCH 29.1  26.0 - 34.0 pg   MCHC 33.5 32.0 - 36.0 g/dL   RDW 13.1 11.5 - 14.5 %   Platelets 207 150 - 440 K/uL  Troponin I     Status: None   Collection Time: 04/09/15 11:44 PM  Result Value Ref Range   Troponin I <0.03 <0.031 ng/mL    Comment:        NO INDICATION OF MYOCARDIAL INJURY.   CK     Status: None   Collection Time: 04/09/15 11:44 PM  Result Value Ref Range   Total CK 380 49 - 397 U/L  Glucose, capillary     Status: Abnormal   Collection Time: 04/09/15 11:47 PM  Result Value Ref Range   Glucose-Capillary 114 (H) 65 - 99 mg/dL  Urinalysis complete, with microscopic ([facility] only)     Status: Abnormal   Collection Time: 04/10/15 12:12 AM  Result Value Ref Range   Color, Urine YELLOW (A) YELLOW   APPearance CLOUDY (A) CLEAR   Glucose, UA NEGATIVE NEGATIVE mg/dL   Bilirubin Urine NEGATIVE NEGATIVE   Ketones, ur NEGATIVE NEGATIVE mg/dL   Specific Gravity, Urine 1.024 1.005 - 1.030   Hgb urine dipstick NEGATIVE NEGATIVE   pH 5.0 5.0 - 8.0   Protein, ur NEGATIVE NEGATIVE mg/dL   Nitrite NEGATIVE NEGATIVE   Leukocytes, UA NEGATIVE NEGATIVE   RBC / HPF 0-5 0 - 5 RBC/hpf   WBC, UA 0-5 0 - 5 WBC/hpf   Bacteria, UA RARE (A) NONE SEEN   Squamous Epithelial / LPF NONE SEEN NONE SEEN   Mucous PRESENT    Granular Casts, UA PRESENT    Amorphous Crystal PRESENT   Basic metabolic panel     Status: Abnormal   Collection Time: 04/10/15  4:25 AM  Result Value Ref Range   Sodium 137 135 - 145 mmol/L   Potassium 3.5 3.5 - 5.1 mmol/L   Chloride 108 101 - 111 mmol/L   CO2 22 22 - 32 mmol/L   Glucose, Bld 92 65 - 99 mg/dL   BUN 13 6 - 20  mg/dL   Creatinine, Ser 0.84 0.61 - 1.24 mg/dL   Calcium 8.7 (L) 8.9 - 10.3 mg/dL   GFR calc non Af Amer >60 >60 mL/min   GFR calc Af Amer >60 >60 mL/min    Comment: (NOTE) The eGFR has been calculated using the CKD EPI equation. This calculation has not been validated in all clinical situations. eGFR's persistently <60 mL/min signify possible Chronic  Kidney Disease.    Anion gap 7 5 - 15  CBC     Status: Abnormal   Collection Time: 04/10/15  4:25 AM  Result Value Ref Range   WBC 17.1 (H) 3.8 - 10.6 K/uL   RBC 4.63 4.40 - 5.90 MIL/uL   Hemoglobin 13.8 13.0 - 18.0 g/dL   HCT 41.1 40.0 - 52.0 %   MCV 88.8 80.0 - 100.0 fL   MCH 29.7 26.0 - 34.0 pg   MCHC 33.4 32.0 - 36.0 g/dL   RDW 13.4 11.5 - 14.5 %   Platelets 191 150 - 440 K/uL    Current Facility-Administered Medications  Medication Dose Route Frequency Provider Last Rate Last Dose  . 0.9 %  sodium chloride infusion   Intravenous Continuous Lytle Butte, MD 75 mL/hr at 04/10/15 0407    . acetaminophen (TYLENOL) tablet 650 mg  650 mg Oral Q6H PRN Lytle Butte, MD       Or  . acetaminophen (TYLENOL) suppository 650 mg  650 mg Rectal Q6H PRN Lytle Butte, MD      . ALPRAZolam Duanne Moron) tablet 1 mg  1 mg Oral TID PRN Lytle Butte, MD   1 mg at 04/10/15 1040  . enoxaparin (LOVENOX) injection 40 mg  40 mg Subcutaneous Q24H Theodoro Grist, MD      . levETIRAcetam (KEPPRA) tablet 500 mg  500 mg Oral BID Theodoro Grist, MD   500 mg at 04/10/15 1623  . morphine 2 MG/ML injection 2 mg  2 mg Intravenous Q4H PRN Lytle Butte, MD   2 mg at 04/10/15 1040  . ondansetron (ZOFRAN) tablet 4 mg  4 mg Oral Q6H PRN Lytle Butte, MD       Or  . ondansetron [facility]) injection 4 mg  4 mg Intravenous Q6H PRN Lytle Butte, MD      . oxyCODONE (Oxy IR/ROXICODONE) immediate release tablet 5 mg  5 mg Oral Q4H PRN Lytle Butte, MD   5 mg at 04/10/15 1312    Musculoskeletal: Strength & Muscle Tone: within normal limits Gait & Station: normal Patient leans: N/A  Psychiatric Specialty Exam: Review of Systems  HENT: Negative.   Eyes: Negative.   Respiratory: Negative.   Cardiovascular: Negative.   Gastrointestinal: Negative.   Musculoskeletal: Positive for back pain.  Skin: Negative.   Neurological: Positive for dizziness, tremors, seizures and weakness.  Psychiatric/Behavioral: Positive for  hallucinations. Negative for depression, suicidal ideas, memory loss and substance abuse. The patient is nervous/anxious and has insomnia.     Blood pressure 119/68, pulse 66, temperature 98.4 F (36.9 C), temperature source Oral, resp. rate 18, height _0  (1.88 m), weight 105.779 kg (233 lb 3.2 oz), SpO2 98 %.Body mass index is 29.93 kg/(m^2).  General Appearance: Fairly Groomed  Engineer, water::  Good  Speech:  Normal Rate  Volume:  Normal  Mood:  Euthymic  Affect:  Full Range  Thought Process:  Goal Directed  Orientation:  Full (Time, Place, and Person)  Thought Content:  Negative  Suicidal Thoughts:  No  Homicidal Thoughts:  No  Memory:  Immediate;   Fair Recent;   Fair Remote;   Fair  Judgement:  Fair  Insight:  Shallow  Psychomotor Activity:  Normal  Concentration:  Fair  Recall:  [place]  Language: Fair  Akathisia:  No  Handed:  Right  AIMS (if indicated):     Assets:  Communication Skills Desire for Improvement Financial Resources/Insurance Housing Resilience Social Support  ADL's:  Intact  Cognition: WNL  Sleep:      Treatment Plan Summary: Plan This is a 50 year old gentleman who is referred for consultation about sleep. His description of his sleep problem is frankly unbelievable. He describes it in dramatic terms that are simply not physiologically possible. He describes this problem with a fairly routine affect and does not seem to be in a great deal of distress. Patient is not depressed. Does not appear to be psychotic. No acute dangerousness at all. We do have past documentation that he hasn't past acknowledge that he had substance abuse problems with benzodiazepines and narcotics. Otherwise no clearcut past psychiatric treatment or evaluation. We also have past documentation of his presenting with odd neurologic symptoms that were not considered to require further workup. Despite presenting over the years with multiple episodes of the  seizures it does not appear that neurologic workup has been considered necessary. During all this together I don't see any need for me to intervene with any medication at this time. There is no acute or emergency problem to this. If he has any problem at all it has been present for over a decade. I'm not sure what to make of his sleep complaints given the unrealistic nature of his description. I would prefer not to add any medication when he is already taking high doses of multiple sedating medicines. Instead I have encouraged the patient to talk about his sleep problem with his primary care doctor and also to follow-up with an outpatient mental health provider. Patient will be given referral information about [facility] and strongly encouraged to go see a therapist and try to get in to see an outpatient mental health provider in the community. He did not have any argument to this. No further treatment at this point.  Disposition: Patient does not meet criteria for psychiatric inpatient admission. Supportive therapy provided about ongoing stressors.  Elya Diloreto 04/10/2015 5:18 PM

## 2015-04-10 NOTE — Progress Notes (Signed)
Spoke at length with Dr Winona LegatoVaickute.  Pt is to be dc home. No eeg. Make sure pt has RX for keppra. [facility]

## 2015-04-10 NOTE — ED Provider Notes (Signed)
[facility] Emergency Department Provider Note  ____________________________________________  Time seen: 11:15 PM  I have reviewed the triage vital signs and the nursing notes.   HISTORY  Chief Complaint Chest Pain and Weakness      HPI Andres ShadFrank B Franzel is a 50 y.o. male presents with history of 2 seizures today while at home. Patient states that he's been having seizures for the past 3-1/2 years however has not taken any antiepileptic medications. Patient denies any drug use no alcohol use. Patient states following events he experienced generalized fatigue and confusion. In addition patient admits to burning chest discomfort and "right arm heaviness" as well as bilateral lower extremity weakness.     Past Medical History  Diagnosis Date  . Chronic back pain   . Myocardial infarction ([facility])   . Angina   . COPD (chronic obstructive pulmonary disease) ([facility])   . Shortness of breath   . Recurrent upper respiratory infection (URI)   . GERD (gastroesophageal reflux disease)   . H/O hiatal hernia   . Headache(784.0)   . Arthritis   . Mental disorder   . Anxiety   . Depression   . Seizures ([facility])   . Migraines   . TIA (transient ischemic attack)   . Emphysema of lung (H[facility])   . Heart attack [facility](HCC)     Patient Active Problem List   Diagnosis Date Noted  . Seizure (H[facility]) 04/10/2015  . Weakness of right side of body 08/19/2011  . Tobacco use disorder 08/19/2011  . COPD (chronic obstructive pulmonary disease) ([facility]) 08/16/2011  . Back pain, chronic 08/16/2011  . Headache(784.0) 08/16/2011  . History of MI (myocardial infarction) 08/16/2011  . Anxiety 08/16/2011    Past Surgical History  Procedure Laterality Date  . Back surgery      Current Outpatient Rx  Name  Route  Sig  Dispense  Refill  . albuterol (PROVENTIL HFA;VENTOLIN HFA) 108 (90 BASE) MCG/ACT inhaler   Inhalation   Inhale 1 puff into the lungs every 6 (six) hours as needed for wheezing or  shortness of breath.         . ALPRAZolam (XANAX) 1 MG tablet   Oral   Take 1 tablet (1 mg total) by mouth 3 (three) times daily as needed for anxiety.   15 tablet   0   . Diphenhydramine-APAP, sleep, (GOODYS PM PO)   Oral   Take 1 packet by mouth at bedtime as needed (for sleep).         Marland Kitchen. oxyCODONE-acetaminophen (PERCOCET/ROXICET) 5-325 MG per tablet   Oral   Take 1 tablet by mouth every 6 (six) hours as needed for severe pain. Patient taking differently: Take 1 tablet by mouth every 8 (eight) hours as needed for severe pain.    15 tablet   0   . cloNIDine (CATAPRES) 0.1 MG tablet   Oral   Take 1 tablet (0.1 mg total) by mouth 3 (three) times daily as needed (opioid withdrawl symptoms). Patient not taking: Reported on 12/24/2014   30 tablet   0   . cyclobenzaprine (FLEXERIL) 10 MG tablet   Oral   Take 1 tablet (10 mg total) by mouth 2 (two) times daily as needed for muscle spasms. Patient not taking: Reported on 09/08/2014   20 tablet   0   . etodolac (LODINE) 200 MG capsule   Oral   Take 1 capsule (200 mg total) by mouth every 8 (eight) hours. Patient not taking: Reported on  04/10/2015   12 capsule   0   . HYDROcodone-acetaminophen (NORCO/VICODIN) 5-325 MG per tablet   Oral   Take 1-2 tablets by mouth every 6 (six) hours as needed for moderate pain or severe pain. Patient not taking: Reported on 04/10/2014   15 tablet   0   . ibuprofen (ADVIL,MOTRIN) 800 MG tablet   Oral   Take 1 tablet (800 mg total) by mouth every 8 (eight) hours as needed. Patient not taking: Reported on 12/24/2014   30 tablet   0   . levETIRAcetam (KEPPRA) 500 MG tablet   Oral   Take 1 tablet (500 mg total) by mouth 2 (two) times daily. Patient not taking: Reported on 04/10/2015   20 tablet   0   . naproxen (NAPROSYN) 375 MG tablet   Oral   Take 1 tablet (375 mg total) by mouth 2 (two) times daily. Patient not taking: Reported on 09/08/2014   20 tablet   0   . penicillin v  potassium (VEETID) 500 MG tablet   Oral   Take 1 tablet (500 mg total) by mouth 3 (three) times daily. Patient not taking: Reported on 09/08/2014   30 tablet   0     Allergies No known drug allergies  Family History  Problem Relation Age of Onset  . Diabetes Neg Hx     Social History Social History  Substance Use Topics  . Smoking status: Current Every Day Smoker -- 0.00 packs/day for 31 years  . Smokeless tobacco: None  . Alcohol Use: No    Review of Systems  Constitutional: Negative for fever. Eyes: Negative for visual changes. ENT: Negative for sore throat. Cardiovascular: Negative for chest pain. Respiratory: Negative for shortness of breath. Gastrointestinal: Negative for abdominal pain, vomiting and diarrhea. Genitourinary: Negative for dysuria. Musculoskeletal: Negative for back pain. Skin: Negative for rash. Neurological: Positive for seizures   10-point ROS otherwise negative.  ____________________________________________   PHYSICAL EXAM:  VITAL SIGNS: ED Triage Vitals  Enc Vitals Group     BP 04/09/15 2340 155/79 mmHg     Pulse Rate 04/09/15 2340 87     Resp 04/09/15 2340 16     Temp 04/09/15 2340 98.8 F (37.1 C)     Temp Source 04/09/15 2340 Oral     SpO2 04/09/15 2340 96 %     Weight 04/09/15 2340 239 lb (108.41 kg)     Height 04/09/15 2340 6' (1.829 m)     Head Cir --      Peak Flow --      Pain Score 04/09/15 2340 8     Pain Loc --      Pain Edu? --      Excl. in GC? --     Constitutional: Alert and oriented. Well appearing and in no distress. Eyes: Conjunctivae are normal. PERRL. Normal extraocular movements. ENT   Head: Normocephalic and atraumatic.   Nose: No congestion/rhinnorhea.   Mouth/Throat: Mucous membranes are moist.   Neck: No stridor. Hematological/Lymphatic/Immunilogical: No cervical lymphadenopathy. Cardiovascular: Normal rate, regular rhythm. Normal and symmetric distal pulses are present in all  extremities. No murmurs, rubs, or gallops. Respiratory: Normal respiratory effort without tachypnea nor retractions. Breath sounds are clear and equal bilaterally. No wheezes/rales/rhonchi. Gastrointestinal: Soft and nontender. No distention. There is no CVA tenderness. Genitourinary: deferred Musculoskeletal: Nontender with normal range of motion in all extremities. No joint effusions.  No lower extremity tenderness nor edema. Neurologic:  Normal speech and language. No  gross focal neurologic deficits are appreciated. Speech is normal.  Skin:  Skin is warm, dry and intact. No rash noted. Psychiatric: Mood and affect are normal. Speech and behavior are normal. Patient exhibits appropriate insight and judgment.  ____________________________________________    LABS (pertinent positives/negatives)  Labs Reviewed  BASIC METABOLIC PANEL - Abnormal; Notable for the following:    Sodium 134 (*)    Glucose, Bld 125 (*)    All other components within normal limits  CBC - Abnormal; Notable for the following:    WBC 20.1 (*)    All other components within normal limits  URINALYSIS COMPLETEWITH MICROSCOPIC ([facility] ONLY) - Abnormal; Notable for the following:    Color, Urine YELLOW (*)    APPearance CLOUDY (*)    Bacteria, UA RARE (*)    All other components within normal limits  GLUCOSE, CAPILLARY - Abnormal; Notable for the following:    Glucose-Capillary 114 (*)    All other components within normal limits  TROPONIN I  CK  BASIC METABOLIC PANEL  CBC  CBG MONITORING, ED     ____________________________________________   EKG  ED ECG REPORT I, Brilynn Biasi, [place], the attending physician, personally viewed and interpreted this ECG.   Date: 04/10/2015  EKG Time: 11:43 PM  Rate: 89  Rhythm: Normal sinus rhythm  Axis: None  Intervals: Normal  ST&T Change: None   ____________________________________________    RADIOLOGY  CT Head Wo Contrast (Final result) Result time: 04/10/15  01:08:43   Final result by Rad Results In Interface (04/10/15 01:08:43)   Narrative:   CLINICAL DATA: Weakness. Right arm heaviness. Seizure earlier today.  EXAM: CT HEAD WITHOUT CONTRAST  TECHNIQUE: Contiguous axial images were obtained from the base of the skull through the vertex without intravenous contrast.  COMPARISON: Most recent CT 02/25/2015  FINDINGS: No intracranial hemorrhage, mass effect, or midline shift. No hydrocephalus. The basilar cisterns are patent. No evidence of territorial infarct. No intracranial fluid collection. Calvarium is intact. Included paranasal sinuses and mastoid air cells are well aerated.  IMPRESSION: No acute intracranial abnormality.   Electronically Signed By: Rubye Oaks M.D. On: 04/10/2015 01:08          INITIAL IMPRESSION / ASSESSMENT AND PLAN / ED COURSE  Pertinent labs & imaging results that were available during my care of the patient were reviewed by me and considered in my medical decision making (see chart for details).  Patient received Keppra 1 g IV. History of physical exam concerning for generalized tonic-clonic seizures with postictal state following the event. Patient discussed with Dr. Clint Guy for hospital admission for further evaluation and management  ____________________________________________   FINAL CLINICAL IMPRESSION(S) / ED DIAGNOSES  Final diagnoses:  Seizure (HCC)      Darci Current, MD 04/10/15 [phone]

## 2015-04-10 NOTE — ED Notes (Signed)
Consult MD at bedside.

## 2015-04-10 NOTE — H&P (Signed)
[facility] - [place] at [facility]   PATIENT NAME: Vincent Dennis    MR#:  [id number]  DATE OF BIRTH:  22-Nov-1965   DATE OF ADMISSION:  04/09/2015  PRIMARY CARE PHYSICIAN: Jearld Lesch, MD   REQUESTING/REFERRING PHYSICIAN: Manson Passey  CHIEF COMPLAINT:   Seizure  HISTORY OF PRESENT ILLNESS:  Vincent Dennis  is a 50 y.o. male with a known history of GERD without esophagitis, prior seizures presenting with seizure activity. Apparently had 2 seizure episodes a day no tongue biting positive urinary incontinence positive post ictal state. He states he is not concerned effort first seizure because this is "about his 10th" however, an hour later he had a second seizure episode this concerned him enough that he presented to the Hospital further workup and evaluation. He states that he did have some preceding aura he perceives seeing flashing lights. Fortunately he is back at his baseline. Despite having a multitude of seizures in the past he has no formal workup, evaluation, on medications.  PAST MEDICAL HISTORY:   Past Medical History  Diagnosis Date  . Chronic back pain   . Myocardial infarction (HCC)   . Angina   . COPD (chronic obstructive pulmonary disease) (HCC)   . Shortness of breath   . Recurrent upper respiratory infection (URI)   . GERD (gastroesophageal reflux disease)   . H/O hiatal hernia   . Headache(784.0)   . Arthritis   . Mental disorder   . Anxiety   . Depression   . Seizures (HCC)   . Migraines   . TIA (transient ischemic attack)   . Emphysema of lung (HCC)   . Heart attack (HCC)     PAST SURGICAL HISTORY:   Past Surgical History  Procedure Laterality Date  . Back surgery      SOCIAL HISTORY:   Social History  Substance Use Topics  . Smoking status: Current Every Day Smoker -- 0.00 packs/day for 31 years  . Smokeless tobacco: Not on file  . Alcohol Use: No    FAMILY HISTORY:   Family History  Problem Relation Age of Onset   . Diabetes Neg Hx     DRUG ALLERGIES:  No Known Allergies  REVIEW OF SYSTEMS:  REVIEW OF SYSTEMS:  CONSTITUTIONAL: Denies fevers, chills, fatigue, weakness.  EYES: Denies blurred vision, double vision, or eye pain.  EARS, NOSE, THROAT: Denies tinnitus, ear pain, hearing loss.  RESPIRATORY: denies cough, shortness of breath, wheezing  CARDIOVASCULAR: Denies chest pain, palpitations, edema.  GASTROINTESTINAL: Denies nausea, vomiting, diarrhea, abdominal pain.  GENITOURINARY: Denies dysuria, hematuria.  ENDOCRINE: Denies nocturia or thyroid problems. HEMATOLOGIC AND LYMPHATIC: Denies easy bruising or bleeding.  SKIN: Denies rash or lesions.  MUSCULOSKELETAL: Denies pain in neck, back, shoulder, knees, hips, or further arthritic symptoms.  NEUROLOGIC: Denies paralysis, paresthesias.  PSYCHIATRIC: Denies anxiety or depressive symptoms. Otherwise full review of systems performed by me is negative.   MEDICATIONS AT HOME:   Prior to Admission medications   Medication Sig Start Date End Date Taking? Authorizing Provider  albuterol (PROVENTIL HFA;VENTOLIN HFA) 108 (90 BASE) MCG/ACT inhaler Inhale 1 puff into the lungs every 6 (six) hours as needed for wheezing or shortness of breath.   Yes Historical Provider, MD  ALPRAZolam Prudy Feeler) 1 MG tablet Take 1 tablet (1 mg total) by mouth 3 (three) times daily as needed for anxiety. 10/02/13  Yes Junious Silk, PA-C  Diphenhydramine-APAP, sleep, (GOODYS PM PO) Take 1 packet by mouth at bedtime as needed (  for sleep).   Yes Historical Provider, MD  oxyCODONE-acetaminophen (PERCOCET/ROXICET) 5-325 MG per tablet Take 1 tablet by mouth every 6 (six) hours as needed for severe pain. Patient taking differently: Take 1 tablet by mouth every 8 (eight) hours as needed for severe pain.  06/05/14  Yes Tiffany Neva SeatGreene, PA-C  cloNIDine (CATAPRES) 0.1 MG tablet Take 1 tablet (0.1 mg total) by mouth 3 (three) times daily as needed (opioid withdrawl symptoms). Patient  not taking: Reported on 12/24/2014 09/08/14 09/08/15  Maurilio LovelyNoelle McLaurin, MD  cyclobenzaprine (FLEXERIL) 10 MG tablet Take 1 tablet (10 mg total) by mouth 2 (two) times daily as needed for muscle spasms. Patient not taking: Reported on 09/08/2014 06/05/14   Marlon Peliffany Greene, PA-C  etodolac (LODINE) 200 MG capsule Take 1 capsule (200 mg total) by mouth every 8 (eight) hours. Patient not taking: Reported on 04/10/2015 02/25/15   Rebecka ApleyAllison P Webster, MD  HYDROcodone-acetaminophen (NORCO/VICODIN) 5-325 MG per tablet Take 1-2 tablets by mouth every 6 (six) hours as needed for moderate pain or severe pain. Patient not taking: Reported on 04/10/2014 10/02/13   Junious SilkHannah Merrell, PA-C  ibuprofen (ADVIL,MOTRIN) 800 MG tablet Take 1 tablet (800 mg total) by mouth every 8 (eight) hours as needed. Patient not taking: Reported on 12/24/2014 10/19/14   Minna AntisKevin Paduchowski, MD  levETIRAcetam (KEPPRA) 500 MG tablet Take 1 tablet (500 mg total) by mouth 2 (two) times daily. Patient not taking: Reported on 04/10/2015 02/25/15   Rebecka ApleyAllison P Webster, MD  naproxen (NAPROSYN) 375 MG tablet Take 1 tablet (375 mg total) by mouth 2 (two) times daily. Patient not taking: Reported on 09/08/2014 06/05/14   Marlon Peliffany Greene, PA-C  penicillin v potassium (VEETID) 500 MG tablet Take 1 tablet (500 mg total) by mouth 3 (three) times daily. Patient not taking: Reported on 09/08/2014 07/16/14   Dahlia ClientHannah Muthersbaugh, PA-C      VITAL SIGNS:  Blood pressure 137/86, pulse 67, temperature 98.8 F (37.1 C), temperature source Oral, resp. rate 19, height 6' (1.829 m), weight 239 lb (108.41 kg), SpO2 94 %.  PHYSICAL EXAMINATION:  VITAL SIGNS: Filed Vitals:   04/10/15 0130 04/10/15 0200  BP: 152/89 137/86  Pulse: 66 67  Temp:    Resp: 16 19   GENERAL:49 y.o.male currently in no acute distress.  HEAD: Normocephalic, atraumatic.  EYES: Pupils equal, round, reactive to light. Extraocular muscles intact. No scleral icterus.  MOUTH: Moist mucosal membrane.  Dentition intact. No abscess noted.  EAR, NOSE, THROAT: Clear without exudates. No external lesions.  NECK: Supple. No thyromegaly. No nodules. No JVD.  PULMONARY: Clear to ascultation, without wheeze rails or rhonci. No use of accessory muscles, Good respiratory effort. good air entry bilaterally CHEST: Nontender to palpation.  CARDIOVASCULAR: S1 and S2. Regular rate and rhythm. No murmurs, rubs, or gallops. No edema. Pedal pulses 2+ bilaterally.  GASTROINTESTINAL: Soft, nontender, nondistended. No masses. Positive bowel sounds. No hepatosplenomegaly.  MUSCULOSKELETAL: No swelling, clubbing, or edema. Range of motion full in all extremities.  NEUROLOGIC: Cranial nerves II through XII are intact. No gross focal neurological deficits. Sensation intact. Reflexes intact.  SKIN: No ulceration, lesions, rashes, or cyanosis. Skin warm and dry. Turgor intact.  PSYCHIATRIC: Mood, affect within normal limits. The patient is awake, alert and oriented x 3. Insight, judgment intact.    LABORATORY PANEL:   CBC  Recent Labs Lab 04/09/15 2344  WBC 20.1*  HGB 14.5  HCT 43.3  PLT 207   ------------------------------------------------------------------------------------------------------------------  Chemistries   Recent Labs Lab 04/09/15 2344  NA 134*  K 3.5  CL 105  CO2 23  GLUCOSE 125*  BUN 16  CREATININE 1.11  CALCIUM 9.1   ------------------------------------------------------------------------------------------------------------------  Cardiac Enzymes  Recent Labs Lab 04/09/15 2344  TROPONINI <0.03   ------------------------------------------------------------------------------------------------------------------  RADIOLOGY:  Ct Head Wo Contrast  04/10/2015  CLINICAL DATA:  Weakness. Right arm heaviness. Seizure earlier today. EXAM: CT HEAD WITHOUT CONTRAST TECHNIQUE: Contiguous axial images were obtained from the base of the skull through the vertex without intravenous  contrast. COMPARISON:  Most recent CT 02/25/2015 FINDINGS: No intracranial hemorrhage, mass effect, or midline shift. No hydrocephalus. The basilar cisterns are patent. No evidence of territorial infarct. No intracranial fluid collection. Calvarium is intact. Included paranasal sinuses and mastoid air cells are well aerated. IMPRESSION: No acute intracranial abnormality. Electronically Signed   By: Rubye Oaks M.D.   On: 04/10/2015 01:08    EKG:   Orders placed or performed during the hospital encounter of 04/09/15  . ED EKG  . ED EKG  . EKG 12-Lead  . EKG 12-Lead  . EKG 12-Lead  . EKG 12-Lead    IMPRESSION AND PLAN:   50 year old Caucasian gentleman history of seizures presenting with seizure activity  1. Seizure: Received Keppra bolus, consult neurology, seizure precautions, will check MRI 2. Venous thromboembolism prophylactic: Heparin subcutaneous   All the records are reviewed and case discussed with ED provider. Management plans discussed with the patient, family and they are in agreement.  CODE STATUS: Full  TOTAL TIME TAKING CARE OF THIS PATIENT: 35  minutes.    Hower,  Mardi Mainland.D on 04/10/2015 at 3:08 AM  Between 7am to 6pm - Pager - [phone]  After 6pm: House Pager: - [phone]  [facility]  Office  [phone]  CC: Primary care physician; Jearld Lesch, MD

## 2015-04-10 NOTE — Progress Notes (Addendum)
Initial Nutrition Assessment   INTERVENTION:   Meals and Snacks: Cater to patient preferences Medical Food Supplement Therapy: will recommend on follow if intake poor   NUTRITION DIAGNOSIS:   No nutrition diagnosis at this time  GOAL:   Patient will meet greater than or equal to 90% of their needs  MONITOR:    (Energy Intake, Anthropometrics, Electrolyte and Renal Profile, Digestive System)  REASON FOR ASSESSMENT:   Malnutrition Screening Tool    ASSESSMENT:   Pt admitted with weakness and seizures. Neurology following. Pt out of room on visit this am.   Past Medical History  Diagnosis Date  . Chronic back pain   . Myocardial infarction (HCC)   . Angina   . COPD (chronic obstructive pulmonary disease) (HCC)   . Shortness of breath   . Recurrent upper respiratory infection (URI)   . GERD (gastroesophageal reflux disease)   . H/O hiatal hernia   . Headache(784.0)   . Arthritis   . Mental disorder   . Anxiety   . Depression   . Seizures (HCC)   . Migraines   . TIA (transient ischemic attack)   . Emphysema of lung (HCC)   . Heart attack [facility](HCC)     Diet Order:  Diet Heart Room service appropriate?: Yes; Fluid consistency:: Thin    Current Nutrition: Recorded po intake of breakfast this am of 100%  Food/Nutrition-Related History: Per MST no decrease in appetite PTA. RD also notes on MST screen, 'pt has been eating healthier' under comments for weight loss.   Scheduled Medications:  . heparin  5,000 Units Subcutaneous 3 times per day    Continuous Medications:  . sodium chloride 75 mL/hr at 04/10/15 0407     Electrolyte/Renal Profile and Glucose Profile:   Recent Labs Lab 04/09/15 2344 04/10/15 0425  NA 134* 137  K 3.5 3.5  CL 105 108  CO2 23 22  BUN 16 13  CREATININE 1.11 0.84  CALCIUM 9.1 8.7*  GLUCOSE 125* 92   Protein Profile: No results for input(s): ALBUMIN in the last 168 hours.  Gastrointestinal Profile: Last BM:  04/09/2015   Weight Change: Per MST pt with weight loss PTA, per [facility] weight encounters pt with weight gain since July 2016 and relatively stable since last year.   Height:   Ht Readings from Last 1 Encounters:  04/10/15 6\' 2"  (1.88 m)    Weight:   Wt Readings from Last 1 Encounters:  04/10/15 233 lb 3.2 oz (105.779 kg)     Wt Readings from Last 10 Encounters:  04/10/15 233 lb 3.2 oz (105.779 kg)  10/19/14 220 lb (99.791 kg)  09/08/14 220 lb (99.791 kg)  07/16/14 220 lb (99.791 kg)  06/05/14 226 lb (102.513 kg)  02/12/14 238 lb 9 oz (108.211 kg)  10/02/13 233 lb (105.688 kg)  07/30/13 230 lb (104.327 kg)  03/16/13 243 lb (110.224 kg)  01/13/13 251 lb 8 oz (114.08 kg)     BMI:  Body mass index is 29.93 kg/(m^2).   EDUCATION NEEDS:   Education needs no appropriate at this time   LOW Care Level  Leda QuailAllyson Ailen Strauch, IowaRD, LDN Pager [phone] Weekend/On-Call Pager [phone]

## 2015-04-10 NOTE — ED Notes (Signed)
[facility], Charity fundraiserN. Pt to be transported to room 104.

## 2015-04-10 NOTE — Progress Notes (Signed)
Pt left in wheelchair at 1950, IV and telemetry box removed. No bleeding was observed at IV site. Site was replaced with gauze and secured with tape. Pt was taken to visitors entrance and pt reportedly left with spouse in vehicle. No signs of distress noted at discharge.

## 2015-04-11 LAB — PROLACTIN: PROLACTIN: 6.9 ng/mL (ref 4.0–15.2)

## 2015-10-13 ENCOUNTER — Other Ambulatory Visit (HOSPITAL_COMMUNITY): Payer: Self-pay

## 2015-10-13 ENCOUNTER — Encounter (HOSPITAL_COMMUNITY): Payer: Self-pay | Admitting: Emergency Medicine

## 2015-10-13 ENCOUNTER — Emergency Department (HOSPITAL_COMMUNITY): Payer: Medicaid Other

## 2015-10-13 ENCOUNTER — Observation Stay (HOSPITAL_COMMUNITY)
Admission: EM | Admit: 2015-10-13 | Discharge: 2015-10-14 | Disposition: A | Discharge status: Home / Self Care | Payer: Medicaid Other | Attending: Internal Medicine | Admitting: Internal Medicine

## 2015-10-13 ENCOUNTER — Other Ambulatory Visit: Payer: Self-pay

## 2015-10-13 DIAGNOSIS — R072 Precordial pain: Principal | ICD-10-CM | POA: Insufficient documentation

## 2015-10-13 DIAGNOSIS — G4700 Insomnia, unspecified: Secondary | ICD-10-CM | POA: Diagnosis present

## 2015-10-13 DIAGNOSIS — Z8673 Personal history of transient ischemic attack (TIA), and cerebral infarction without residual deficits: Secondary | ICD-10-CM | POA: Diagnosis not present

## 2015-10-13 DIAGNOSIS — J439 Emphysema, unspecified: Secondary | ICD-10-CM | POA: Diagnosis not present

## 2015-10-13 DIAGNOSIS — R079 Chest pain, unspecified: Secondary | ICD-10-CM | POA: Diagnosis not present

## 2015-10-13 DIAGNOSIS — I252 Old myocardial infarction: Secondary | ICD-10-CM

## 2015-10-13 DIAGNOSIS — M549 Dorsalgia, unspecified: Secondary | ICD-10-CM | POA: Diagnosis not present

## 2015-10-13 DIAGNOSIS — G8929 Other chronic pain: Secondary | ICD-10-CM | POA: Diagnosis present

## 2015-10-13 DIAGNOSIS — Z87898 Personal history of other specified conditions: Secondary | ICD-10-CM

## 2015-10-13 DIAGNOSIS — F17200 Nicotine dependence, unspecified, uncomplicated: Secondary | ICD-10-CM | POA: Diagnosis not present

## 2015-10-13 DIAGNOSIS — F419 Anxiety disorder, unspecified: Secondary | ICD-10-CM | POA: Diagnosis present

## 2015-10-13 DIAGNOSIS — Z79899 Other long term (current) drug therapy: Secondary | ICD-10-CM | POA: Diagnosis not present

## 2015-10-13 DIAGNOSIS — Z8669 Personal history of other diseases of the nervous system and sense organs: Secondary | ICD-10-CM | POA: Diagnosis not present

## 2015-10-13 DIAGNOSIS — J449 Chronic obstructive pulmonary disease, unspecified: Secondary | ICD-10-CM | POA: Diagnosis present

## 2015-10-13 LAB — BASIC METABOLIC PANEL
Anion gap: 7 (ref 5–15)
BUN: 14 mg/dL (ref 6–20)
CO2: 24 mmol/L (ref 22–32)
CREATININE: 0.93 mg/dL (ref 0.61–1.24)
Calcium: 9.2 mg/dL (ref 8.9–10.3)
Chloride: 111 mmol/L (ref 101–111)
GFR calc Af Amer: >60 mL/min (ref >60)
GLUCOSE: 89 mg/dL (ref 65–99)
POTASSIUM: 4.1 mmol/L (ref 3.5–5.1)
SODIUM: 142 mmol/L (ref 135–145)

## 2015-10-13 LAB — CBC
HCT: 42.6 % (ref 39.0–52.0)
Hemoglobin: 14.3 g/dL (ref 13.0–17.0)
MCH: 29.7 pg (ref 26.0–34.0)
MCHC: 33.6 g/dL (ref 30.0–36.0)
MCV: 88.4 fL (ref 78.0–100.0)
PLATELETS: 210 10*3/uL (ref 150–400)
RBC: 4.82 MIL/uL (ref 4.22–5.81)
RDW: 12.5 % (ref 11.5–15.5)
WBC: 10.4 10*3/uL (ref 4.0–10.5)

## 2015-10-13 LAB — RAPID URINE DRUG SCREEN, HOSP PERFORMED
AMPHETAMINES: NOT DETECTED
BARBITURATES: NOT DETECTED
Benzodiazepines: POSITIVE — AB
COCAINE: NOT DETECTED
Opiates: NOT DETECTED
TETRAHYDROCANNABINOL: NOT DETECTED

## 2015-10-13 LAB — I-STAT TROPONIN, ED
TROPONIN I, POC: 0 ng/mL (ref 0.00–0.08)
Troponin i, poc: 0 ng/mL (ref 0.00–0.08)

## 2015-10-13 LAB — TROPONIN I: Troponin I: <0.03 ng/mL (ref <0.03)

## 2015-10-13 LAB — ETHANOL: Alcohol, Ethyl (B): <5 mg/dL (ref <5)

## 2015-10-13 MED ORDER — ASPIRIN 81 MG PO CHEW: 324.0000 mg | CHEWABLE_TABLET | Freq: Once | ORAL | Status: DC

## 2015-10-13 MED ORDER — ASPIRIN EC 325 MG PO TBEC
325.0000 mg | DELAYED_RELEASE_TABLET | Freq: Every day | ORAL | Status: DC
  Administered 2015-10-14: 325 mg via ORAL
  Filled 2015-10-13: qty 1

## 2015-10-13 MED ORDER — ONDANSETRON HCL 4 MG/2ML IJ SOLN: 4.0000 mg | Freq: Four times a day (QID) | INTRAMUSCULAR | Status: DC | PRN

## 2015-10-13 MED ORDER — GI COCKTAIL ~~LOC~~: 30.0000 mL | Freq: Four times a day (QID) | ORAL | Status: DC | PRN

## 2015-10-13 MED ORDER — ALPRAZOLAM 0.5 MG PO TABS
1.0000 mg | ORAL_TABLET | Freq: Three times a day (TID) | ORAL | Status: DC | PRN
  Administered 2015-10-13 – 2015-10-14 (×3): 1 mg via ORAL
  Filled 2015-10-13 (×4): qty 2

## 2015-10-13 MED ORDER — OXYCODONE-ACETAMINOPHEN 5-325 MG PO TABS
1.0000 | ORAL_TABLET | Freq: Three times a day (TID) | ORAL | Status: DC | PRN
  Administered 2015-10-13 – 2015-10-14 (×3): 1 via ORAL
  Filled 2015-10-13 (×3): qty 1

## 2015-10-13 MED ORDER — ENOXAPARIN SODIUM 40 MG/0.4ML ~~LOC~~ SOLN
40.0000 mg | SUBCUTANEOUS | Status: DC
  Administered 2015-10-13: 40 mg via SUBCUTANEOUS
  Filled 2015-10-13: qty 0.4

## 2015-10-13 MED ORDER — LEVETIRACETAM 500 MG PO TABS
500.0000 mg | ORAL_TABLET | Freq: Two times a day (BID) | ORAL | Status: DC
  Administered 2015-10-13 – 2015-10-14 (×2): 500 mg via ORAL
  Filled 2015-10-13 (×2): qty 1

## 2015-10-13 MED ORDER — ALPRAZOLAM 0.25 MG PO TABS
1.0000 mg | ORAL_TABLET | Freq: Once | ORAL | Status: AC
  Administered 2015-10-13: 1 mg via ORAL
  Filled 2015-10-13: qty 4

## 2015-10-13 MED ORDER — LEVETIRACETAM 500 MG PO TABS
500.0000 mg | ORAL_TABLET | Freq: Once | ORAL | Status: AC
  Administered 2015-10-13: 500 mg via ORAL
  Filled 2015-10-13: qty 1

## 2015-10-13 MED ORDER — GI COCKTAIL ~~LOC~~
30.0000 mL | Freq: Three times a day (TID) | ORAL | Status: DC | PRN
  Administered 2015-10-13: 30 mL via ORAL
  Filled 2015-10-13: qty 30

## 2015-10-13 MED ORDER — ACETAMINOPHEN 325 MG PO TABS
650.0000 mg | ORAL_TABLET | ORAL | Status: DC | PRN
  Administered 2015-10-13 – 2015-10-14 (×2): 650 mg via ORAL
  Filled 2015-10-13 (×2): qty 2

## 2015-10-13 MED ORDER — NITROGLYCERIN 0.4 MG SL SUBL
0.4000 mg | SUBLINGUAL_TABLET | SUBLINGUAL | Status: DC | PRN
  Administered 2015-10-13: 0.4 mg via SUBLINGUAL
  Filled 2015-10-13: qty 1

## 2015-10-13 MED ORDER — OXYCODONE-ACETAMINOPHEN 10-325 MG PO TABS: 1.0000 | ORAL_TABLET | Freq: Three times a day (TID) | ORAL | Status: DC | PRN

## 2015-10-13 MED ORDER — OXYCODONE HCL 5 MG PO TABS
5.0000 mg | ORAL_TABLET | Freq: Three times a day (TID) | ORAL | Status: DC | PRN
  Administered 2015-10-14 (×2): 5 mg via ORAL
  Filled 2015-10-13 (×2): qty 1

## 2015-10-13 MED ORDER — ALBUTEROL SULFATE (2.5 MG/3ML) 0.083% IN NEBU: 3.0000 mL | INHALATION_SOLUTION | Freq: Four times a day (QID) | RESPIRATORY_TRACT | Status: DC | PRN

## 2015-10-13 MED ORDER — QUETIAPINE FUMARATE 50 MG PO TABS
100.0000 mg | ORAL_TABLET | Freq: Every day | ORAL | Status: DC
  Administered 2015-10-13: 100 mg via ORAL
  Filled 2015-10-13: qty 2

## 2015-10-13 NOTE — ED Notes (Signed)
Patient transported to X-ray 

## 2015-10-13 NOTE — ED Provider Notes (Signed)
CSN: [id number]     Arrival date & time 10/13/15  1350 History   First MD Initiated Contact with Patient 10/13/15 1352     Chief Complaint  Patient presents with  . Chest Pain     (Consider location/radiation/quality/duration/timing/severity/associated sxs/prior Treatment) Patient is a 50 y.o. male presenting with chest pain. The history is provided by the patient.  Chest Pain Pain location:  Substernal area Pain quality: pressure   Pain radiates to:  R arm and L arm Pain radiates to the back: no   Pain severity:  Moderate Onset quality:  Sudden Duration:  2 hours Timing:  Constant Progression:  Worsening Chronicity:  New Context: no drug use   Relieved by:  None tried Associated symptoms: diaphoresis   Associated symptoms: no abdominal pain, no back pain, no cough, no dizziness, no fever, no nausea, no palpitations, no shortness of breath and not vomiting     Past Medical History  Diagnosis Date  . Chronic back pain   . Myocardial infarction (HCC)   . Angina   . COPD (chronic obstructive pulmonary disease) (HCC)   . Shortness of breath   . Recurrent upper respiratory infection (URI)   . GERD (gastroesophageal reflux disease)   . H/O hiatal hernia   . Headache(784.0)   . Arthritis   . Mental disorder   . Anxiety   . Depression   . Seizures (HCC)   . Migraines   . TIA (transient ischemic attack)   . Emphysema of lung (HCC)   . Heart attack [facility])    Past Surgical History  Procedure Laterality Date  . Back surgery     Family History  Problem Relation Age of Onset  . Diabetes Neg Hx    Social History  Substance Use Topics  . Smoking status: Current Every Day Smoker -- 0.00 packs/day for 31 years  . Smokeless tobacco: None  . Alcohol Use: No    Review of Systems  Constitutional: Positive for diaphoresis. Negative for fever and chills.  HENT: Negative for congestion and sore throat.   Eyes: Negative for pain.  Respiratory: Negative for cough and shortness  of breath.   Cardiovascular: Positive for chest pain. Negative for palpitations and leg swelling.  Gastrointestinal: Negative for nausea, vomiting, abdominal pain and diarrhea.  Endocrine: Negative.   Genitourinary: Negative for flank pain.  Musculoskeletal: Negative for back pain and neck pain.  Skin: Negative for rash.  Allergic/Immunologic: Negative.   Neurological: Negative for dizziness, syncope and light-headedness.  Psychiatric/Behavioral: Negative for confusion.      Allergies  Review of patient's allergies indicates no known allergies.  Home Medications   Prior to Admission medications   Medication Sig Start Date End Date Taking? Authorizing Provider  ALPRAZolam Prudy Feeler) 1 MG tablet Take 1 tablet (1 mg total) by mouth 3 (three) times daily as needed for anxiety. 10/02/13  Yes Junious Silk, PA-C  levETIRAcetam (KEPPRA) 500 MG tablet Take 1 tablet (500 mg total) by mouth 2 (two) times daily. 04/10/15  Yes Katharina Caper, MD  oxyCODONE-acetaminophen (PERCOCET) 10-325 MG tablet Take 1 tablet by mouth 3 (three) times daily as needed for pain.    Yes Historical Provider, MD  QUEtiapine Fumarate (SEROQUEL PO) Take 1 tablet by mouth at bedtime.   Yes Historical Provider, MD  albuterol (PROVENTIL HFA;VENTOLIN HFA) 108 (90 BASE) MCG/ACT inhaler Inhale 1 puff into the lungs every 6 (six) hours as needed for wheezing or shortness of breath.    Historical Provider, MD  cloNIDine (CATAPRES) 0.1 MG tablet Take 1 tablet (0.1 mg total) by mouth 3 (three) times daily as needed (opioid withdrawl symptoms). Patient not taking: Reported on 12/24/2014 09/08/14 09/08/15  Maurilio LovelyNoelle McLaurin, MD  cyclobenzaprine (FLEXERIL) 10 MG tablet Take 1 tablet (10 mg total) by mouth 2 (two) times daily as needed for muscle spasms. Patient not taking: Reported on 09/08/2014 06/05/14   Marlon Peliffany Greene, PA-C  etodolac (LODINE) 200 MG capsule Take 1 capsule (200 mg total) by mouth every 8 (eight) hours. Patient not taking:  Reported on 04/10/2015 02/25/15   Rebecka ApleyAllison P Webster, MD  ibuprofen (ADVIL,MOTRIN) 800 MG tablet Take 1 tablet (800 mg total) by mouth every 8 (eight) hours as needed. Patient not taking: Reported on 12/24/2014 10/19/14   Minna AntisKevin Paduchowski, MD  oxyCODONE-acetaminophen (PERCOCET/ROXICET) 5-325 MG per tablet Take 1 tablet by mouth every 6 (six) hours as needed for severe pain. Patient taking differently: Take 1 tablet by mouth every 8 (eight) hours as needed for severe pain.  06/05/14   Tiffany Neva SeatGreene, PA-C   BP 113/64 mmHg  Pulse 62  Temp(Src) 98.4 F (36.9 C) (Oral)  Resp 17  SpO2 97% Physical Exam  Constitutional: He is oriented to person, place, and time. He appears well-developed and well-nourished.  HENT:  Head: Normocephalic and atraumatic.  Eyes: Conjunctivae and EOM are normal. Pupils are equal, round, and reactive to light.  Neck: Normal range of motion. Neck supple.  Cardiovascular: Normal rate, regular rhythm, normal heart sounds and intact distal pulses.   Pulses:      Radial pulses are 2+ on the right side, and 2+ on the left side.  Pulmonary/Chest: Effort normal and breath sounds normal. No respiratory distress. He has no wheezes. He has no rales.  Abdominal: Soft. Bowel sounds are normal. There is no tenderness.  Musculoskeletal: Normal range of motion.  Neurological: He is alert and oriented to person, place, and time. He has normal strength and normal reflexes. No cranial nerve deficit or sensory deficit. GCS eye subscore is 4. GCS verbal subscore is 5. GCS motor subscore is 6.  Normal finger to nose bilaterally.   No pronator drift bilaterally.    Skin: Skin is warm and dry.    ED Course  Procedures (including critical care time) Labs Review Labs Reviewed  CBC  BASIC METABOLIC PANEL  ETHANOL  URINE RAPID DRUG SCREEN, HOSP PERFORMED  TROPONIN I  TROPONIN I  LEVETIRACETAM LEVEL  I-STAT TROPOININ, ED  Rosezena SensorI-STAT TROPOININ, ED    Imaging Review Dg Chest 2  View  10/13/2015  CLINICAL DATA:  Chest pain, dizziness EXAM: CHEST  2 VIEW COMPARISON:  01/09/2015 FINDINGS: Cardiomediastinal silhouette is stable. No acute infiltrate or pleural effusion. No pulmonary edema. Bony thorax is unremarkable. IMPRESSION: No active cardiopulmonary disease. Electronically Signed   By: Natasha MeadLiviu  Pop M.D.   On: 10/13/2015 15:13   I have personally reviewed and evaluated these images and lab results as part of my medical decision-making.   EKG Interpretation None      MDM   Final diagnoses:  Chest pain, unspecified chest pain type    The patient is a 50 year old male with a history of reported MI in the past as well as chronic anxiety presented today for chest pain. He reports substernal pressure-like chest pain rating to bilateral arms that started 2 hours prior to arrival.  On initial evaluation patient is imminently stable and in no acute distress. He appears diaphoretic however. Given ASA 325 and sublingual nitroglycerin. Sublingual  nitroglycerin brought pain down from 8 to a 4 per the patient. ECG with no ST or T-wave changes, normal rate, normal intervals. Troponin negative. Due to reports of previous MI and current risk factors recommend admission for observation for cycling troponins and likely stress test.  CXR without acute cardiopulmonary abnormalities and low suspicion for pneumonia COPD exacerbation at this time.  The patient also reports he is very anxious but denies any SI or HI at this time. He recurrently asked for something "for my nerves". Reports he is on Xanax at home and given 1 dose of his home anxiety regimen per the patient. He also reports he has a history of seizure disorder but denies taking any of his home Keppra. Given Keppra in the ED.  Discussed with hospitalist who evaluated the patient in the ED and agrees to admission.  Lab, ECG, and CXR were viewed by myself and incorporated into medical decision making.  Discussed pertinent finding  with patient or caregiver prior to admission with no further questions.  Pt care supervised by my attending Dr. Silas [place], MD PGY-3 Emergency Medicine     Tery Sanfilippo, MD 10/13/15 1649  Blane Ohara, MD 10/17/15 1538

## 2015-10-13 NOTE — ED Notes (Signed)
Pt here with c/o chest pain that started today , pt received 324asa and 4 of zofran from ems . No sob , no nitro due to low b/p

## 2015-10-13 NOTE — ED Notes (Signed)
Pt given urinal for specimen.  

## 2015-10-13 NOTE — ED Notes (Signed)
Pt reports taking xanax 1mg  TID.  Pt reports not taking since yesterday.

## 2015-10-13 NOTE — H&P (Signed)
History and Physical    Vincent Dennis:[id number] DOB: [birth year] DOA: 10/13/2015   PCP: Jearld Lesch, MD   Patient coming from/Resides with: Private residence/lives with wife  Chief Complaint: Chest pains  HPI: Vincent Dennis is a 50 y.o. male with medical history significant for reported myocardial infarction 16 years ago in [place], chronic back pain on chronic narcotics and is disabled, COPD and ongoing tobacco abuse, insomnia and seizures. Patient reports that at 11 AM this morning while sitting at the table with his family he began to become very sweaty and became short of breath his arms felt heavy began hurting in the middle of his chest. He thought he might feel better if he walked outside and as he was walking towards the front door he "collapsed". When asked if he lost consciousness the patient stated no that he became very weak and his legs became unsteady. He reports that the symptoms were similar to his prior myocardial infarction. When questioned about whether a stent was placed during underwent a cardiac catheterization the patient reported he did undergo cardiac catheterization and he stayed in the hospital 9 days but did not have a stent placed. He states is some point he was on Plavix and is supposed to be taking aspirin daily but does not take. When asked if he had started any new meds recently he said no but he was supposed to take 2 medications which he cannot recall the names of. One was to help his Seroquel work at bedtime and the other was to help his Xanax worked better. Patient is currently chest pain-free. Prior to my leaving the room the patient has to follows 1 reorder his home medications specifically his Xanax and his Percocet since he states these the main medications that he takes.  ED Course:  Vital signs: 98.4-127/77-73-18-room air 96% 2 view chest x-ray: No acute process Lab data: Sodium 142, potassium 4.1, BUN 14, creatinine 0.93, initial poc troponin  0.00, white count 10,400 differential not obtained, hemoglobin 14.3, platelets 210,000, glucose 89 Medications and treatments: Aspirin 324 mg by mouth 1, Keppra 500 mg by mouth 1, sublingual nitroglycerin 0.4 mg 1  Review of Systems:  In addition to the HPI above,  No Fever-chills, myalgias or other constitutional symptoms No Headache, changes with Vision or hearing, new weakness, tingling, numbness in any extremity, No problems swallowing food or Liquids, indigestion/reflux No Cough, palpitations, orthopnea or DOE No Abdominal pain, N/V; no melena or hematochezia, no dark tarry stools, Bowel movements are regular, No dysuria, hematuria or flank pain No new skin rashes, lesions, masses or bruises, No new joints pains-aches No recent weight gain or loss No polyuria, polydypsia or polyphagia,   Past Medical History  Diagnosis Date  . Chronic back pain   . Myocardial infarction (HCC)   . Angina   . COPD (chronic obstructive pulmonary disease) (HCC)   . Shortness of breath   . Recurrent upper respiratory infection (URI)   . GERD (gastroesophageal reflux disease)   . H/O hiatal hernia   . Headache(784.0)   . Arthritis   . Mental disorder   . Anxiety   . Depression   . Seizures (HCC)   . Migraines   . TIA (transient ischemic attack)   . Emphysema of lung (HCC)   . Heart attack [facility])     Past Surgical History  Procedure Laterality Date  . Back surgery      Social History   Social History  .  Marital Status: Legally Separated    Spouse Name: N/A  . Number of Children: N/A  . Years of Education: N/A   Occupational History  . Not on file.   Social History Main Topics  . Smoking status: Current Every Day Smoker -- 1.00 packs/day for 31 years  . Smokeless tobacco: Not on file  . Alcohol Use: No  . Drug Use: Yes    Special: Marijuana  . Sexual Activity: Not on file     Comment: narcotics, xanax   Other Topics Concern  . Not on file   Social History Narrative     Mobility: Without assistive devices Work history: Disabled   No Known Allergies  Family History  Problem Relation Age of Onset  . Diabetes Neg Hx      Prior to Admission medications   Medication Sig Start Date End Date Taking? Authorizing Provider  ALPRAZolam Prudy Feeler) 1 MG tablet Take 1 tablet (1 mg total) by mouth 3 (three) times daily as needed for anxiety. 10/02/13  Yes Junious Silk, PA-C  levETIRAcetam (KEPPRA) 500 MG tablet Take 1 tablet (500 mg total) by mouth 2 (two) times daily. 04/10/15  Yes Katharina Caper, MD  oxyCODONE-acetaminophen (PERCOCET) 10-325 MG tablet Take 1 tablet by mouth 3 (three) times daily as needed for pain.    Yes Historical Provider, MD  QUEtiapine Fumarate (SEROQUEL PO) Take 1 tablet by mouth at bedtime.   Yes Historical Provider, MD  albuterol (PROVENTIL HFA;VENTOLIN HFA) 108 (90 BASE) MCG/ACT inhaler Inhale 1 puff into the lungs every 6 (six) hours as needed for wheezing or shortness of breath.    Historical Provider, MD  cloNIDine (CATAPRES) 0.1 MG tablet Take 1 tablet (0.1 mg total) by mouth 3 (three) times daily as needed (opioid withdrawl symptoms). Patient not taking: Reported on 12/24/2014 09/08/14 09/08/15  Maurilio Lovely, MD  cyclobenzaprine (FLEXERIL) 10 MG tablet Take 1 tablet (10 mg total) by mouth 2 (two) times daily as needed for muscle spasms. Patient not taking: Reported on 09/08/2014 06/05/14   Marlon Pel, PA-C  etodolac (LODINE) 200 MG capsule Take 1 capsule (200 mg total) by mouth every 8 (eight) hours. Patient not taking: Reported on 04/10/2015 02/25/15   Rebecka Apley, MD  ibuprofen (ADVIL,MOTRIN) 800 MG tablet Take 1 tablet (800 mg total) by mouth every 8 (eight) hours as needed. Patient not taking: Reported on 12/24/2014 10/19/14   Minna Antis, MD  oxyCODONE-acetaminophen (PERCOCET/ROXICET) 5-325 MG per tablet Take 1 tablet by mouth every 6 (six) hours as needed for severe pain. Patient taking differently: Take 1 tablet by  mouth every 8 (eight) hours as needed for severe pain.  06/05/14   Marlon Pel, PA-C    Physical Exam: Filed Vitals:   10/13/15 1445 10/13/15 1530 10/13/15 1545 10/13/15 1600  BP: 120/78 117/76 104/58 113/64  Pulse: 64 64 64 62  Temp:      TempSrc:      Resp: 15 19 13 17   SpO2: 99% 97% 97% 97%      Constitutional: NAD, calm, comfortable Eyes: PERRL, lids and conjunctivae normal ENMT: Mucous membranes are moist. Posterior pharynx clear of any exudate or lesions.Normal dentition.  Neck: normal, supple, no masses, no thyromegaly Respiratory: clear to auscultation bilaterally, no wheezing, no crackles. Normal respiratory effort. No accessory muscle use. Chest wall was nontender to palpation. Cardiovascular: Regular rate and rhythm, no murmurs / rubs / gallops. No extremity edema. 2+ pedal pulses. No carotid bruits.  Abdomen: no tenderness, no masses palpated.  No hepatosplenomegaly. Bowel sounds positive.  Musculoskeletal: no clubbing / cyanosis. No joint deformity upper and lower extremities. Good ROM, no contractures. Normal muscle tone.  Skin: no rashes, lesions, ulcers. No induration Neurologic: CN 2-12 grossly intact. Sensation intact, DTR normal. Strength 5/5 x all 4 extremities.  Psychiatric: Normal judgment and insight. Alert and oriented x 3. Normal mood.    Labs on Admission: I have personally reviewed following labs and imaging studies  CBC:  Recent Labs Lab 10/13/15 1445  WBC 10.4  HGB 14.3  HCT 42.6  MCV 88.4  PLT 210   Basic Metabolic Panel:  Recent Labs Lab 10/13/15 1445  NA 142  K 4.1  CL 111  CO2 24  GLUCOSE 89  BUN 14  CREATININE 0.93  CALCIUM 9.2   GFR: CrCl cannot be calculated (Unknown ideal weight.). Liver Function Tests: No results for input(s): AST, ALT, ALKPHOS, BILITOT, PROT, ALBUMIN in the last 168 hours. No results for input(s): LIPASE, AMYLASE in the last 168 hours. No results for input(s): AMMONIA in the last 168  hours. Coagulation Profile: No results for input(s): INR, PROTIME in the last 168 hours. Cardiac Enzymes: No results for input(s): CKTOTAL, CKMB, CKMBINDEX, TROPONINI in the last 168 hours. BNP (last 3 results) No results for input(s): PROBNP in the last 8760 hours. HbA1C: No results for input(s): HGBA1C in the last 72 hours. CBG: No results for input(s): GLUCAP in the last 168 hours. Lipid Profile: No results for input(s): CHOL, HDL, LDLCALC, TRIG, CHOLHDL, LDLDIRECT in the last 72 hours. Thyroid Function Tests: No results for input(s): TSH, T4TOTAL, FREET4, T3FREE, THYROIDAB in the last 72 hours. Anemia Panel: No results for input(s): VITAMINB12, FOLATE, FERRITIN, TIBC, IRON, RETICCTPCT in the last 72 hours. Urine analysis:    Component Value Date/Time   COLORURINE YELLOW* 04/10/2015 0012   APPEARANCEUR CLOUDY* 04/10/2015 0012   LABSPEC 1.024 04/10/2015 0012   PHURINE 5.0 04/10/2015 0012   GLUCOSEU NEGATIVE 04/10/2015 0012   HGBUR NEGATIVE 04/10/2015 0012   BILIRUBINUR NEGATIVE 04/10/2015 0012   KETONESUR NEGATIVE 04/10/2015 0012   PROTEINUR NEGATIVE 04/10/2015 0012   UROBILINOGEN 0.2 03/16/2013 1311   NITRITE NEGATIVE 04/10/2015 0012   LEUKOCYTESUR NEGATIVE 04/10/2015 0012   Sepsis Labs: @LABRCNTIP (procalcitonin:4,lacticidven:4) )No results found for this or any previous visit (from the past 240 hour(s)).   Radiological Exams on Admission: Dg Chest 2 View  10/13/2015  CLINICAL DATA:  Chest pain, dizziness EXAM: CHEST  2 VIEW COMPARISON:  01/09/2015 FINDINGS: Cardiomediastinal silhouette is stable. No acute infiltrate or pleural effusion. No pulmonary edema. Bony thorax is unremarkable. IMPRESSION: No active cardiopulmonary disease. Electronically Signed   By: Natasha MeadLiviu  Pop M.D.   On: 10/13/2015 15:13    EKG: (Independently reviewed) Sinus rhythm with ventricular rate 77 bpm, QTC 436 ms, normal EKG without any ischemic changes  Assessment/Plan Principal Problem:   Chest  pain syndrome/ History of MI (myocardial infarction) -Patient presents with substernal chest pain with associated shortness of breath and diaphoresis similar to prior reported MI 16 years ago -Cycle troponin -Echocardiogram -Plan nuclear medicine Lexiscan stress test in a.m. unless enzymes become positive -Continue aspirin -No beta blocker secondary to suboptimal blood pressure -Currently chest pain-free and no indication for full dose anticoagulation -Check lipid panel -Risk factors include: Reported history of prior MI, ongoing tobacco abuse, age greater than 5850, male sex -I have been unable to locate records regarding patient's prior cardiac catheterization in the electronic medical record  Active Problems:    COPD (chronic  obstructive pulmonary disease)  -Currently compensated without wheezing or persistent shortness of breath or hypoxemia -Continue preadmission albuterol MDI    Back pain, chronic -Continue preadmission Percocet -Recommend do not escalate narcotics -Check urine drug screen and serum alcohol level    Anxiety -Continue Xanax as prior to admission    Tobacco use disorder -Counseled regarding cessation -Nicotine patch    History of seizures -Was last hospitalized in January 2017 for recurrent seizures; during that admission patient had MRI of the brain that was normal unfortunately an EEG could not be done during that admission and after consultation with neurology patient was started on Keppra -Continue Keppra -Check Levetiracetam level    Insomnia -Continue preadmission Seroquel although dosage was not recorded so I have started with 100 mg at hour of sleep      DVT prophylaxis: Lovenox  Code Status: Full code Family Communication: No family at the bedside  Disposition Plan: Anticipate discharge back to preadmission, once medically stable Consults called: None Admission status: Observation/telemetry    Shanine Kreiger L. ANP-BC [facility] Pager [phone]   If 7PM-7AM, please contact night-coverage [email or web address] Password [email or web address]  10/13/2015, 4:20 PM

## 2015-10-14 ENCOUNTER — Observation Stay (HOSPITAL_COMMUNITY): Payer: Medicaid Other

## 2015-10-14 ENCOUNTER — Observation Stay (HOSPITAL_BASED_OUTPATIENT_CLINIC_OR_DEPARTMENT_OTHER): Payer: Medicaid Other

## 2015-10-14 DIAGNOSIS — F17200 Nicotine dependence, unspecified, uncomplicated: Secondary | ICD-10-CM

## 2015-10-14 DIAGNOSIS — M549 Dorsalgia, unspecified: Secondary | ICD-10-CM | POA: Diagnosis not present

## 2015-10-14 DIAGNOSIS — J439 Emphysema, unspecified: Secondary | ICD-10-CM

## 2015-10-14 DIAGNOSIS — I252 Old myocardial infarction: Secondary | ICD-10-CM | POA: Diagnosis not present

## 2015-10-14 DIAGNOSIS — R079 Chest pain, unspecified: Secondary | ICD-10-CM

## 2015-10-14 DIAGNOSIS — Z8669 Personal history of other diseases of the nervous system and sense organs: Secondary | ICD-10-CM

## 2015-10-14 DIAGNOSIS — F419 Anxiety disorder, unspecified: Secondary | ICD-10-CM | POA: Diagnosis not present

## 2015-10-14 DIAGNOSIS — G4700 Insomnia, unspecified: Secondary | ICD-10-CM

## 2015-10-14 DIAGNOSIS — G8929 Other chronic pain: Secondary | ICD-10-CM

## 2015-10-14 LAB — NM MYOCAR MULTI W/SPECT W/WALL MOTION / EF
CHL CUP NUCLEAR SRS: 10
CHL CUP NUCLEAR SSS: 4
CHL CUP RESTING HR STRESS: 52 {beats}/min
CHL CUP STRESS STAGE 1 DBP: 79 mmHg
CHL CUP STRESS STAGE 1 GRADE: 0 %
CHL CUP STRESS STAGE 2 HR: 49 {beats}/min
CHL CUP STRESS STAGE 3 SBP: 119 mmHg
CHL CUP STRESS STAGE 3 SPEED: 0 mph
CHL CUP STRESS STAGE 4 GRADE: 0 %
CHL CUP STRESS STAGE 4 SPEED: 0 mph
CSEPPBP: 117 mmHg
Estimated workload: 1 METS
Exercise duration (min): 5 min
Exercise duration (sec): 43 s
LV dias vol: 134 mL (ref 62–150)
LV sys vol: 59 mL
NUC STRESS TID: 1.16
Peak HR: 64 {beats}/min
Percent of predicted max HR: 37 %
RATE: 0.24
SDS: 4
Stage 1 HR: 48 {beats}/min
Stage 1 SBP: 115 mmHg
Stage 1 Speed: 0 mph
Stage 2 Grade: 0 %
Stage 2 Speed: 0 mph
Stage 3 DBP: 56 mmHg
Stage 3 Grade: 0 %
Stage 3 HR: 78 {beats}/min
Stage 4 DBP: 57 mmHg
Stage 4 HR: 64 {beats}/min
Stage 4 SBP: 117 mmHg

## 2015-10-14 LAB — LIPID PANEL
CHOLESTEROL: 187 mg/dL (ref 0–200)
HDL: 27 mg/dL — ABNORMAL LOW (ref >40)
LDL Cholesterol: 107 mg/dL — ABNORMAL HIGH (ref 0–99)
Total CHOL/HDL Ratio: 6.9 RATIO
Triglycerides: 266 mg/dL — ABNORMAL HIGH (ref <150)
VLDL: 53 mg/dL — ABNORMAL HIGH (ref 0–40)

## 2015-10-14 LAB — TROPONIN I

## 2015-10-14 LAB — ECHOCARDIOGRAM COMPLETE: Weight: 3609.6 oz

## 2015-10-14 MED ORDER — NITROGLYCERIN 0.4 MG SL SUBL: 0.4000 mg | SUBLINGUAL_TABLET | SUBLINGUAL | Status: AC | PRN

## 2015-10-14 MED ORDER — TECHNETIUM TC 99M TETROFOSMIN IV KIT
10.0000 | PACK | Freq: Once | INTRAVENOUS | Status: AC | PRN
  Administered 2015-10-14: 10 via INTRAVENOUS

## 2015-10-14 MED ORDER — LEVETIRACETAM 500 MG PO TABS: 500.0000 mg | ORAL_TABLET | Freq: Two times a day (BID) | ORAL | Status: AC

## 2015-10-14 MED ORDER — TECHNETIUM TC 99M TETROFOSMIN IV KIT
30.0000 | PACK | Freq: Once | INTRAVENOUS | Status: AC | PRN
  Administered 2015-10-14: 30 via INTRAVENOUS

## 2015-10-14 MED ORDER — REGADENOSON 0.4 MG/5ML IV SOLN
INTRAVENOUS | Status: AC
  Administered 2015-10-14: 0.4 mg via INTRAVENOUS
  Filled 2015-10-14: qty 5

## 2015-10-14 MED ORDER — REGADENOSON 0.4 MG/5ML IV SOLN
0.4000 mg | Freq: Once | INTRAVENOUS | Status: AC
Start: 2015-10-14 — End: 2015-10-14
  Administered 2015-10-14: 0.4 mg via INTRAVENOUS
  Filled 2015-10-14: qty 5

## 2015-10-14 MED ORDER — ASPIRIN EC 81 MG PO TBEC: 81.0000 mg | DELAYED_RELEASE_TABLET | Freq: Every day | ORAL | Status: AC

## 2015-10-14 NOTE — Progress Notes (Signed)
Patient presented for Lexiscan. Minimal ST changes prior to test compared to yesterday. ? Pericarditis. Discussed with Dr. Tenny Crawoss who advised ok to proceed. Tolerated procedure well. Result to follow.

## 2015-10-14 NOTE — Discharge Summary (Signed)
Physician Discharge Summary   Patient ID: Vincent Dennis MRN: [id number] DOB/AGE: March 18, 1966 50 y.o.  Admit date: 10/13/2015 Discharge date: 10/14/2015  Primary Care Physician:  Jearld Lesch, MD  Discharge Diagnoses:   . Atypical chest pain  . Anxiety . Back pain, chronic . COPD (chronic obstructive pulmonary disease) (HCC) . Insomnia . Tobacco use disorder . Atypical chest pain   Consults: Cardiology  Recommendations for Outpatient Follow-up:  1. Please repeat CBC/BMET at next visit   DIET: Heart healthy diet    Allergies:  No Known Allergies   DISCHARGE MEDICATIONS: Current Discharge Medication List    START taking these medications   Details  aspirin EC 81 MG tablet Take 1 tablet (81 mg total) by mouth daily. Over the counter Qty: 30 tablet, Refills: 3    nitroGLYCERIN (NITROSTAT) 0.4 MG SL tablet Place 1 tablet (0.4 mg total) under the tongue every 5 (five) minutes as needed for chest pain. Qty: 30 tablet, Refills: 12      CONTINUE these medications which have CHANGED   Details  levETIRAcetam (KEPPRA) 500 MG tablet Take 1 tablet (500 mg total) by mouth 2 (two) times daily. Qty: 60 tablet, Refills: 4      CONTINUE these medications which have NOT CHANGED   Details  ALPRAZolam (XANAX) 1 MG tablet Take 1 tablet (1 mg total) by mouth 3 (three) times daily as needed for anxiety. Qty: 15 tablet, Refills: 0    oxyCODONE-acetaminophen (PERCOCET) 10-325 MG tablet Take 1 tablet by mouth 3 (three) times daily as needed for pain.     QUEtiapine Fumarate (SEROQUEL PO) Take 1 tablet by mouth at bedtime.    albuterol (PROVENTIL HFA;VENTOLIN HFA) 108 (90 BASE) MCG/ACT inhaler Inhale 1 puff into the lungs every 6 (six) hours as needed for wheezing or shortness of breath.    oxyCODONE-acetaminophen (PERCOCET/ROXICET) 5-325 MG per tablet Take 1 tablet by mouth every 6 (six) hours as needed for severe pain. Qty: 15 tablet, Refills: 0      STOP taking these  medications     cloNIDine (CATAPRES) 0.1 MG tablet      cyclobenzaprine (FLEXERIL) 10 MG tablet      etodolac (LODINE) 200 MG capsule      ibuprofen (ADVIL,MOTRIN) 800 MG tablet          Brief H and P: For complete details please refer to admission H and P, but in briefFrank B Endsley is a 50 y.o. male with medical history significant for reported myocardial infarction 16 years ago in [place], chronic back pain on chronic narcotics and is disabled, COPD and ongoing tobacco abuse, insomnia and seizures. Patient reports that at 11 AM this morning while sitting at the table with his family he began to become very sweaty and became short of breath his arms felt heavy began hurting in the middle of his chest. He thought he might feel better if he walked outside and as he was walking towards the front door he "collapsed". When asked if he lost consciousness the patient stated no that he became very weak and his legs became unsteady. He reports that the symptoms were similar to his prior myocardial infarction. When questioned about whether a stent was placed during underwent a cardiac catheterization the patient reported he did undergo cardiac catheterization and he stayed in the hospital 9 days but did not have a stent placed. He states is some point he was on Plavix and is supposed to be taking aspirin daily but  does not take. When asked if he had started any new meds recently he said no but he was supposed to take 2 medications which he cannot recall the names of. One was to help his Seroquel work at bedtime and the other was to help his Xanax worked better. Patient is currently chest pain-free.   Hospital Course:   Chest pain syndrome/ History of MI (myocardial infarction) -Patient presents with substernal chest pain with associated shortness of breath and diaphoresis similar to prior "reported MI" 16 years ago. Last cath 03/2008 for chest pain showed normal coronaries with LV EF of 50-55% --> felt  MSK vs GERD. Multiple missed appointment afterwards.  -Serial troponins remained negative. Patient was placed on aspirin. No beta blockers can do to suboptimal BP.  -Patient was seen by cardiology and stressed medical compliance. Patient underwent 2-D echo which showed EF of 60-65% with grade 1 diastolic dysfunction. Compared to prior echo in 2013, EF had improved. Patient also underwent nuclear medicine stress test which was low risk stress nuclear study with mild diaphragmatic attenuation artifact otherwise normal perfusion, normal left ventricular regional and global systolic function, EF 56%. -  patient was cleared by cardiology to be discharged home, recommended follow-up with Dr. Diona BrownerMcDowell in the [place] office.   COPD (chronic obstructive pulmonary disease)  -Currently compensated without wheezing or persistent shortness of breath or hypoxemia -Continue preadmission albuterol MDI   Back pain, chronic -Continue preadmission Percocet, patient to follow with his PCP regarding his pain medications.    Anxiety -Continue Xanax, follow-up with PCP regarding refills   Tobacco use disorder -Counseled regarding cessation -Nicotine patch   History of seizures -Was last hospitalized in January 2017 for recurrent seizures; during that admission patient had MRI of the brain that was normal, unfortunately an EEG could not be done during that admission and after consultation with neurology, patient was started on Keppra -Continue Keppra. Per patient, he had run out of Keppra, prescription was given    Insomnia -Continue preadmission Seroquel although dosage was not recorded so I have started with 100 mg at hour of sleep    Day of Discharge BP 117/57 mmHg  Pulse 67  Temp(Src) 97.4 F (36.3 C) (Oral)  Resp 18  Wt 102.331 kg (225 lb 9.6 oz)  SpO2 97%  Physical Exam: General: Alert and awake oriented x3 not in any acute distress. HEENT: anicteric sclera, pupils reactive to light and  accommodation CVS: S1-S2 clear no murmur rubs or gallops Chest: clear to auscultation bilaterally, no wheezing rales or rhonchi Abdomen: soft nontender, nondistended, normal bowel sounds Extremities: no cyanosis, clubbing or edema noted bilaterally Neuro: Cranial nerves II-XII intact, no focal neurological deficits   The results of significant diagnostics from this hospitalization (including imaging, microbiology, ancillary and laboratory) are listed below for reference.    LAB RESULTS: Basic Metabolic Panel:  Recent Labs Lab 10/13/15 1445  NA 142  K 4.1  CL 111  CO2 24  GLUCOSE 89  BUN 14  CREATININE 0.93  CALCIUM 9.2   Liver Function Tests: No results for input(s): AST, ALT, ALKPHOS, BILITOT, PROT, ALBUMIN in the last 168 hours. No results for input(s): LIPASE, AMYLASE in the last 168 hours. No results for input(s): AMMONIA in the last 168 hours. CBC:  Recent Labs Lab 10/13/15 1445  WBC 10.4  HGB 14.3  HCT 42.6  MCV 88.4  PLT 210   Cardiac Enzymes:  Recent Labs Lab 10/13/15 2110 10/14/15 0245  TROPONINI <0.03 <0.03  BNP: Invalid input(s): POCBNP CBG: No results for input(s): GLUCAP in the last 168 hours.  Significant Diagnostic Studies:  Dg Chest 2 View  10/13/2015  CLINICAL DATA:  Chest pain, dizziness EXAM: CHEST  2 VIEW COMPARISON:  01/09/2015 FINDINGS: Cardiomediastinal silhouette is stable. No acute infiltrate or pleural effusion. No pulmonary edema. Bony thorax is unremarkable. IMPRESSION: No active cardiopulmonary disease. Electronically Signed   By: Natasha Mead M.D.   On: 10/13/2015 15:13   Nm Myocar Multi W/spect W/wall Motion / Ef  10/14/2015   There was no ST segment deviation noted during stress.  Defect 1: There is a small defect of mild severity present in the mid inferior location.  The study is normal.  Nuclear stress EF: 56%.  This is a low risk study.  Low risk stress nuclear study with mild diaphragmatic attenuation artifact,  otherwise normal perfusion and normal left ventricular regional and global systolic function.    2D ECHO: Study Conclusions  - Left ventricle: The cavity size was normal. Wall thickness was  increased in a pattern of mild LVH. Systolic function was normal.  The estimated ejection fraction was in the range of 60% to 65%.  Wall motion was normal; there were no regional wall motion  abnormalities. Doppler parameters are consistent with abnormal  left ventricular relaxation (grade 1 diastolic dysfunction). The  E/e&' ratio is betwen 8-15, suggesting indeterminate LV filling  pressure. - Mitral valve: Structurally normal valve. There was trivial  regurgitation. - Left atrium: The atrium was normal in size. - Right ventricle: The cavity size was mildly dilated. The  moderator band was prominent. Systolic function is low normal. - Right atrium: The atrium was normal in size. - Inferior vena cava: The vessel was normal in size. The  respirophasic diameter changes were in the normal range (>= 50%),  consistent with normal central venous pressure.  Impressions:  - Compared to a prior echo in 2013, the LVEF has improved to  60-65%.  Disposition and Follow-up: Discharge Instructions    Diet - low sodium heart healthy    Complete by:  As directed      Increase activity slowly    Complete by:  As directed             DISPOSITION: home    DISCHARGE FOLLOW-UP Follow-up Information    Follow up with Jearld Lesch, MD. Schedule an appointment as soon as possible for a visit in 2 weeks.   Specialty:  Family Medicine   Why:  for hospital follow-up   Contact information:   [address] [place] [address] [phone]       Follow up with Nona Dell, MD. Schedule an appointment as soon as possible for a visit in 2 weeks.   Specialty:  Cardiology   Why:  for hospital follow-up   Contact information:   [address] [place] [address] [phone]         Time spent on Discharge:   Signed:   Shevonne Wolf M.D. [facility] 10/14/2015, 3:07 PM Pager: [phone]

## 2015-10-14 NOTE — Consult Note (Signed)
CARDIOLOGY CONSULT NOTE   Patient ID: Vincent Dennis MRN: [id number] DOB/AGE: Aug 09, 1965 50 y.o.  Admit date: 10/13/2015  Primary Physician   Jearld Leschwight M Williams, MD Primary Cardiologist  New (previosuly Dr. Antoine PocheHochrein 2005) last seen by Dr. Hope PigeonBeery 2010 during admission consult - Multiple No show Reason for Consultation   Chest pain  Requesting Physician  Dr. Isidoro Donningai  HPI: Vincent Dennis is a 50 y.o. male with a history of MI in 2005 with normal cath, chronic chest pain, COPD, TIAs. Emphysema, ongoing tobacco abuse, PTSD, GERD hiatal hernia and noncompliant who presented for chest pain.   Cardiac catheterization at that time showed normal coronary arteries with an ejection fraction of 50%, and it was felt perhaps that his chest pain was contributed by spasm.   Last cath 03/2008 for chest pain showed normal coronaries with LV EF of 50-55% --> felt MSK vs GERD.  Multiple missed appointment afterwards.   Patient has developed a substernal chest tightness yesterday around 11 AM while sitting. Patient suddenly became diaphoretic and had shortness of breath and nausea. His chest tightness that radiated to bilateral shoulder. Patient felt weak and felt like he is going to pass out. No syncope or seizure. This pain is similar to his previous MI but less severe. He is chest pain-free since admission. Patient states that he is not taking his seizure medication for the past few weeks. Smokes one pack a day for the past 35 years.He denies lower extremity edema, orthopnea, PND, syncope, palpitations, melena or blood in his stool or urine. The patient walks about 30 minutes a day without any dyspnea or chest pain.  Troponin x 2 negative. LDL 107. Electrolytes normal. Alcohol level normal. Urine drug screen positive for benzodiazepine. Chest x-ray clear. EKG shows sinus rhythm at rate of 77 bpm.   Past Medical History  Diagnosis Date  . Chronic back pain   . Myocardial infarction (HCC)   . Angina   . COPD  (chronic obstructive pulmonary disease) (HCC)   . Shortness of breath   . Recurrent upper respiratory infection (URI)   . GERD (gastroesophageal reflux disease)   . H/O hiatal hernia   . Headache(784.0)   . Arthritis   . Mental disorder   . Anxiety   . Depression   . Seizures (HCC)   . Migraines   . TIA (transient ischemic attack)   . Emphysema of lung (HCC)   . Heart attack [facility](HCC)      Past Surgical History  Procedure Laterality Date  . Back surgery      No Known Allergies  I have reviewed the patient's current medications . aspirin  324 mg Oral Once  . aspirin EC  325 mg Oral Daily  . enoxaparin (LOVENOX) injection  40 mg Subcutaneous Q24H  . levETIRAcetam  500 mg Oral BID  . QUEtiapine  100 mg Oral QHS     acetaminophen, albuterol, ALPRAZolam, gi cocktail, gi cocktail, nitroGLYCERIN, ondansetron (ZOFRAN) IV, oxyCODONE-acetaminophen **AND** oxyCODONE  Prior to Admission medications   Medication Sig Start Date End Date Taking? Authorizing Provider  ALPRAZolam Prudy Feeler(XANAX) 1 MG tablet Take 1 tablet (1 mg total) by mouth 3 (three) times daily as needed for anxiety. 10/02/13  Yes Junious SilkHannah Merrell, PA-C  levETIRAcetam (KEPPRA) 500 MG tablet Take 1 tablet (500 mg total) by mouth 2 (two) times daily. 04/10/15  Yes Katharina Caperima Vaickute, MD  oxyCODONE-acetaminophen (PERCOCET) 10-325 MG tablet Take 1 tablet by mouth 3 (three) times daily as needed for  pain.    Yes Historical Provider, MD  QUEtiapine Fumarate (SEROQUEL PO) Take 1 tablet by mouth at bedtime.   Yes Historical Provider, MD  albuterol (PROVENTIL HFA;VENTOLIN HFA) 108 (90 BASE) MCG/ACT inhaler Inhale 1 puff into the lungs every 6 (six) hours as needed for wheezing or shortness of breath.    Historical Provider, MD  cloNIDine (CATAPRES) 0.1 MG tablet Take 1 tablet (0.1 mg total) by mouth 3 (three) times daily as needed (opioid withdrawl symptoms). Patient not taking: Reported on 12/24/2014 09/08/14 09/08/15  Maurilio Lovely, MD    cyclobenzaprine (FLEXERIL) 10 MG tablet Take 1 tablet (10 mg total) by mouth 2 (two) times daily as needed for muscle spasms. Patient not taking: Reported on 09/08/2014 06/05/14   Marlon Pel, PA-C  etodolac (LODINE) 200 MG capsule Take 1 capsule (200 mg total) by mouth every 8 (eight) hours. Patient not taking: Reported on 04/10/2015 02/25/15   Rebecka Apley, MD  ibuprofen (ADVIL,MOTRIN) 800 MG tablet Take 1 tablet (800 mg total) by mouth every 8 (eight) hours as needed. Patient not taking: Reported on 12/24/2014 10/19/14   Minna Antis, MD  oxyCODONE-acetaminophen (PERCOCET/ROXICET) 5-325 MG per tablet Take 1 tablet by mouth every 6 (six) hours as needed for severe pain. Patient taking differently: Take 1 tablet by mouth every 8 (eight) hours as needed for severe pain.  06/05/14   Marlon Pel, PA-C     Social History   Social History  . Marital Status: Legally Separated    Spouse Name: N/A  . Number of Children: N/A  . Years of Education: N/A   Occupational History  . Not on file.   Social History Main Topics  . Smoking status: Current Every Day Smoker -- 0.00 packs/day for 31 years  . Smokeless tobacco: Not on file  . Alcohol Use: No  . Drug Use: Yes    Special: Marijuana  . Sexual Activity: Not on file     Comment: narcotics, xanax   Other Topics Concern  . Not on file   Social History Narrative    No family status information on file.   Family History  Problem Relation Age of Onset  . Diabetes Neg Hx        ROS:  Full 14 point review of systems complete and found to be negative unless listed above.  Physical Exam: Blood pressure 100/59, pulse 58, temperature 97.7 F (36.5 C), temperature source Oral, resp. rate 18, weight 225 lb 9.6 oz (102.331 kg), SpO2 97 %.  General: Well developed, well nourished, male in no acute distress Head: Eyes PERRLA, No xanthomas. Normocephalic and atraumatic, oropharynx without edema or exudate.  Lungs: Resp regular and  unlabored, CTA. Heart: RRR no s3, s4, or murmurs..   Neck: No carotid bruits. No lymphadenopathy. No  JVD. Abdomen: Bowel sounds present, abdomen soft and non-tender without masses or hernias noted. Msk:  No spine or cva tenderness. No weakness, no joint deformities or effusions. Extremities: No clubbing, cyanosis or edema. DP/PT/Radials 2+ and equal bilaterally. Neuro: Alert and oriented X 3. No focal deficits noted. Psych:  Good affect, responds appropriately Skin: No rashes or lesions noted.  Labs:   Lab Results  Component Value Date   WBC 10.4 10/13/2015   HGB 14.3 10/13/2015   HCT 42.6 10/13/2015   MCV 88.4 10/13/2015   PLT 210 10/13/2015   No results for input(s): INR in the last 72 hours.  Recent Labs Lab 10/13/15 1445  NA 142  K 4.1  CL 111  CO2 24  BUN 14  CREATININE 0.93  CALCIUM 9.2  GLUCOSE 89   No results found for: MG  Recent Labs  10/13/15 2110 10/14/15 0245  TROPONINI <0.03 <0.03    Recent Labs  10/13/15 1502 10/13/15 1832  TROPIPOC 0.00 0.00   PRO B NATRIURETIC PEPTIDE (BNP)  Date/Time Value Ref Range Status  04/02/2009 09:09 PM <30.0 0.0 - 100.0 pg/mL Final   Lab Results  Component Value Date   CHOL 187 10/14/2015   HDL 27* 10/14/2015   LDLCALC 107* 10/14/2015   TRIG 266* 10/14/2015    Echo: 07/2011 ------------------------------------------------------------ LV EF: 50% -  55%  ------------------------------------------------------------ Indications:   TIA 435.9.  ------------------------------------------------------------ History:  PMH:  Chronic obstructive pulmonary disease. Risk factors: Anxiety. History of MI. Back Pain. Headache.  ------------------------------------------------------------ Study Conclusions  Left ventricle: The cavity size was normal. Systolic function was normal. The estimated ejection fraction was in the range of 50% to 55%. Wall motion was normal; there were no regional wall motion  abnormalities.  ECG: Sinus rhythm  Vent. rate 77 BPM PR interval 138 ms QRS duration 88 ms QT/QTc 386/436 ms P-R-T axes 57 56 60   Cath 04/23/2008 HEMODYNAMIC DATA: Central aortic pressure was 110/68. Left ventricle pressure 110/90, post A-wave 21.  Angiographic data: Left main coronary was angiographically normal and trifurcated into an LAD, ramus intermediate vessel and a dominant left circumflex coronary system.  The LAD and its branches were angiographically normal. The LAD gave rise to one major septal and diagonal vessel and several small additional branches.  The ramus intermediate vessel was angiographically normal.  The circumflex vessel was angiographically normal and gave rise to 2 additional marginal vessels and ended in the posterolateral PDA like system.  The right coronary artery was nondominant but angiographically normal vessel.  RAO ventriculography revealed normal ejection fraction without focal segmental wall motion abnormalities. Ejection fraction was in the 50- 55% range.  Distal aortography did not reveal any evidence for renal artery stenosis. There was no significant aortoiliac disease.  IMPRESSION: 1. Low normal/normal left ventricular contractility with an ejection  fraction of 50-55%. 2. Normal coronary arteries.  RECOMMENDATIONS: Medical therapy with continued risk factor modification. Radiology:  Dg Chest 2 View  10/13/2015  CLINICAL DATA:  Chest pain, dizziness EXAM: CHEST  2 VIEW COMPARISON:  01/09/2015 FINDINGS: Cardiomediastinal silhouette is stable. No acute infiltrate or pleural effusion. No pulmonary edema. Bony thorax is unremarkable. IMPRESSION: No active cardiopulmonary disease. Electronically Signed   By: Natasha Mead M.D.   On: 10/13/2015 15:13    ASSESSMENT AND PLAN:     1. Chest pain  - Concerning for episode of coronary vasospasm. His cardiac risk factor includes prior history of  MI and ongoing tobacco abuse. Differential includes GI. The patient has ruled out. EKG without ischemic changes. Troponins negative. The patient has a long-standing history of noncompliance. He is nothing by mouth. Pending echocardiogram. We will review with M.D for inpatient versus outpatient stress test. Patient is willing to follow-up with in ED in office as he leaves over there.  2. Tobacco abuse - Encouraged cessation. Education given.  3. History of seizures - Last episode a few months ago. He has been out of seizure medication for the past few weeks. Encourage compliance.   SignedManson Passey, PA 10/14/2015, 8:29 AM Pager [phone]  Co-Sign MD  Pt seen and examined  I agree with findings as noted by B Bhagat above   50 yo with history  of MI in 2005  Normal cath  Presents with CP  Has r/o for MI   He has been noncompliant with meds ,continues to smoke On exam, complains of back pain  Chest feeling some better Lungs are CTA  Chest nontender  Cardiac RRR  No S3 or murmur   Ext without edema   I would recomm Lexiscan myoview to evaluate for ischemia  Dietrich Pates

## 2016-08-28 ENCOUNTER — Emergency Department (HOSPITAL_COMMUNITY)
Admission: EM | Admit: 2016-08-28 | Discharge: 2016-08-28 | Disposition: A | Discharge status: Home / Self Care | Payer: Medicaid Other | Attending: Emergency Medicine | Admitting: Emergency Medicine

## 2016-08-28 ENCOUNTER — Emergency Department (HOSPITAL_COMMUNITY): Payer: Medicaid Other

## 2016-08-28 ENCOUNTER — Encounter (HOSPITAL_COMMUNITY): Payer: Self-pay | Admitting: Emergency Medicine

## 2016-08-28 DIAGNOSIS — Z7982 Long term (current) use of aspirin: Secondary | ICD-10-CM | POA: Diagnosis not present

## 2016-08-28 DIAGNOSIS — F17200 Nicotine dependence, unspecified, uncomplicated: Secondary | ICD-10-CM | POA: Diagnosis not present

## 2016-08-28 DIAGNOSIS — Z8673 Personal history of transient ischemic attack (TIA), and cerebral infarction without residual deficits: Secondary | ICD-10-CM | POA: Diagnosis not present

## 2016-08-28 DIAGNOSIS — R062 Wheezing: Secondary | ICD-10-CM

## 2016-08-28 DIAGNOSIS — J441 Chronic obstructive pulmonary disease with (acute) exacerbation: Secondary | ICD-10-CM | POA: Insufficient documentation

## 2016-08-28 DIAGNOSIS — Z79899 Other long term (current) drug therapy: Secondary | ICD-10-CM | POA: Insufficient documentation

## 2016-08-28 DIAGNOSIS — R0602 Shortness of breath: Secondary | ICD-10-CM | POA: Diagnosis present

## 2016-08-28 LAB — CBC
HEMATOCRIT: 42.2 % (ref 39.0–52.0)
HEMOGLOBIN: 13.9 g/dL (ref 13.0–17.0)
MCH: 28.9 pg (ref 26.0–34.0)
MCHC: 32.9 g/dL (ref 30.0–36.0)
MCV: 87.7 fL (ref 78.0–100.0)
Platelets: 179 10*3/uL (ref 150–400)
RBC: 4.81 MIL/uL (ref 4.22–5.81)
RDW: 12.9 % (ref 11.5–15.5)
WBC: 5.3 10*3/uL (ref 4.0–10.5)

## 2016-08-28 LAB — BASIC METABOLIC PANEL
ANION GAP: 7 (ref 5–15)
BUN: 17 mg/dL (ref 6–20)
CO2: 26 mmol/L (ref 22–32)
Calcium: 8.6 mg/dL — ABNORMAL LOW (ref 8.9–10.3)
Chloride: 106 mmol/L (ref 101–111)
Creatinine, Ser: 1 mg/dL (ref 0.61–1.24)
GFR calc Af Amer: >60 mL/min (ref >60)
GFR calc non Af Amer: >60 mL/min (ref >60)
Glucose, Bld: 119 mg/dL — ABNORMAL HIGH (ref 65–99)
POTASSIUM: 4.1 mmol/L (ref 3.5–5.1)
Sodium: 139 mmol/L (ref 135–145)

## 2016-08-28 LAB — I-STAT TROPONIN, ED: Troponin i, poc: 0 ng/mL (ref 0.00–0.08)

## 2016-08-28 MED ORDER — GUAIFENESIN 100 MG/5ML PO SOLN: 5.0000 mL | ORAL | 0 refills | Status: AC | PRN

## 2016-08-28 MED ORDER — PREDNISONE 20 MG PO TABS
60.0000 mg | ORAL_TABLET | Freq: Once | ORAL | Status: AC
  Administered 2016-08-28: 60 mg via ORAL
  Filled 2016-08-28: qty 3

## 2016-08-28 MED ORDER — ALBUTEROL SULFATE HFA 108 (90 BASE) MCG/ACT IN AERS
4.0000 | INHALATION_SPRAY | Freq: Once | RESPIRATORY_TRACT | Status: AC
  Administered 2016-08-28: 4 via RESPIRATORY_TRACT
  Filled 2016-08-28: qty 6.7

## 2016-08-28 MED ORDER — PREDNISONE 10 MG PO TABS: 40.0000 mg | ORAL_TABLET | Freq: Every day | ORAL | 0 refills | Status: AC

## 2016-08-28 NOTE — ED Triage Notes (Signed)
Reports right sided chest pain for the last 2 years.  C/o SOB for the last 2 weeks.  Reports coughing up large amounts of thick green sputum.  Breathing easy and non-labored in triage.

## 2016-08-28 NOTE — ED Notes (Signed)
Returned from xray

## 2016-08-28 NOTE — ED Provider Notes (Signed)
[facility]-EMERGENCY DEPT Provider Note   CSN: [id number] Arrival date & time: 08/28/16  1944     History   Chief Complaint Chief Complaint  Patient presents with  . Shortness of Breath    HPI Andres ShadFrank B Monteforte is a 51 y.o. male.  The history is provided by the patient and medical records.  Shortness of Breath  This is a chronic problem. The average episode lasts 2 years. The problem occurs frequently.The current episode started more than 1 week ago. The problem has not changed since onset.Associated symptoms include a fever, cough, sputum production, wheezing, orthopnea and chest pain (Also for 2 years). Pertinent negatives include no sore throat, no ear pain, no vomiting, no abdominal pain and no rash. He has tried nothing for the symptoms. Associated medical issues include COPD and CAD.    Past Medical History:  Diagnosis Date  . Angina   . Anxiety   . Arthritis   . Chronic back pain   . COPD (chronic obstructive pulmonary disease) (HCC)   . Depression   . Emphysema of lung (HCC)   . GERD (gastroesophageal reflux disease)   . H/O hiatal hernia   . Headache(784.0)   . Heart attack (HCC)   . Mental disorder   . Migraines   . Myocardial infarction (HCC)   . Recurrent upper respiratory infection (URI)   . Seizures (HCC)   . Shortness of breath   . TIA (transient ischemic attack)     Patient Active Problem List   Diagnosis Date Noted  . Chest pain syndrome 10/13/2015  . History of seizures 04/10/2015  . Hyponatremia 04/10/2015  . Leukocytosis 04/10/2015  . Obesity 04/10/2015  . Insomnia 04/10/2015  . Weakness of right side of body 08/19/2011  . Tobacco use disorder 08/19/2011  . COPD (chronic obstructive pulmonary disease) (HCC) 08/16/2011  . Back pain, chronic 08/16/2011  . Headache(784.0) 08/16/2011  . History of MI (myocardial infarction) 08/16/2011  . Anxiety 08/16/2011    Past Surgical History:  Procedure Laterality Date  . BACK SURGERY         Home  Medications    Prior to Admission medications   Medication Sig Start Date End Date Taking? Authorizing Provider  albuterol (PROVENTIL HFA;VENTOLIN HFA) 108 (90 BASE) MCG/ACT inhaler Inhale 1 puff into the lungs every 6 (six) hours as needed for wheezing or shortness of breath.   Yes [provider]  ALPRAZolam Prudy Feeler(XANAX) 1 MG tablet Take 1 tablet (1 mg total) by mouth 3 (three) times daily as needed for anxiety. 10/02/13  Yes Junious SilkMerrell, Hannah, PA-C  aspirin EC 81 MG tablet Take 1 tablet (81 mg total) by mouth daily. Over the counter 10/14/15  Yes Rai, Ripudeep K, MD  methocarbamol (ROBAXIN) 750 MG tablet Take 750 mg by mouth 2 (two) times daily as needed for pain. 08/26/16  Yes [provider]  nitroGLYCERIN (NITROSTAT) 0.4 MG SL tablet Place 1 tablet (0.4 mg total) under the tongue every 5 (five) minutes as needed for chest pain. 10/14/15  Yes Rai, Ripudeep K, MD  Oxycodone HCl 10 MG TABS Take 10 mg by mouth every 6 (six) hours as needed for pain. 08/26/16  Yes [provider]  guaiFENesin (ROBITUSSIN) 100 MG/5ML SOLN Take 5 mLs (100 mg total) by mouth every 4 (four) hours as needed for cough or to loosen phlegm. 08/28/16   Blane OharaZavitz, Joshua, MD  levETIRAcetam (KEPPRA) 500 MG tablet Take 1 tablet (500 mg total) by mouth 2 (two) times daily. 10/14/15  Rai, Ripudeep K, MD  predniSONE (DELTASONE) 10 MG tablet Take 4 tablets (40 mg total) by mouth daily. 08/29/16 09/02/16  Hebert Soho, MD    Family History Family History  Problem Relation Age of Onset  . Diabetes Neg Hx     Social History Social History  Substance Use Topics  . Smoking status: Current Every Day Smoker    Packs/day: 0.00    Years: 31.00  . Smokeless tobacco: Former Neurosurgeon    Types: Snuff  . Alcohol use No     Allergies   Seroquel [quetiapine]   Review of Systems Review of Systems  Constitutional: Positive for appetite change, fatigue and fever. Negative for chills.  HENT: Negative for ear pain and sore throat.     Eyes: Negative for pain and visual disturbance.  Respiratory: Positive for cough, sputum production, shortness of breath and wheezing.   Cardiovascular: Positive for chest pain (Also for 2 years) and orthopnea. Negative for palpitations.  Gastrointestinal: Negative for abdominal pain and vomiting.  Genitourinary: Negative for dysuria and hematuria.  Musculoskeletal: Negative for arthralgias and back pain.  Skin: Negative for color change and rash.  Neurological: Negative for seizures and syncope.  Psychiatric/Behavioral: Positive for sleep disturbance.  All other systems reviewed and are negative.    Physical Exam Updated Vital Signs BP 122/81   Pulse (!) 52   Temp 98 F (36.7 C) (Oral)   Resp (!) 21   Ht 6\' 3"  (1.905 m)   Wt 102.1 kg (225 lb)   SpO2 (!) 75%   BMI 28.12 kg/m   Physical Exam  Constitutional: He appears well-developed.  HENT:  Head: Normocephalic and atraumatic.  Eyes: Conjunctivae are normal.  Neck: Neck supple.  Cardiovascular: Normal rate and regular rhythm.   No murmur heard. Pulmonary/Chest: Effort normal. No respiratory distress. He has wheezes (Diffuse wheezes on exam).  Abdominal: Soft. There is no tenderness.  Musculoskeletal: He exhibits no edema.  Neurological: He is alert. No cranial nerve deficit. Coordination normal.  Skin: Skin is warm and dry.  Nursing note and vitals reviewed.    ED Treatments / Results  Labs (all labs ordered are listed, but only abnormal results are displayed) Labs Reviewed  BASIC METABOLIC PANEL - Abnormal; Notable for the following:       Result Value   Glucose, Bld 119 (*)    Calcium 8.6 (*)    All other components within normal limits  CBC  I-STAT TROPOININ, ED    EKG  EKG Interpretation None       Radiology Dg Chest 2 View  Result Date: 08/28/2016 CLINICAL DATA:  Right-sided chest pain for 2 years with increasing shortness of Breath EXAM: CHEST  2 VIEW COMPARISON:  03/06/2016 FINDINGS: Cardiac  shadow is within normal limits. The lungs are well aerated bilaterally. No focal infiltrate or sizable effusion is seen. No bony abnormality is noted. IMPRESSION: No active cardiopulmonary disease. Electronically Signed   By: Alcide Clever M.D.   On: 08/28/2016 20:55    Procedures Procedures (including critical care time)  Medications Ordered in ED Medications  albuterol (PROVENTIL HFA;VENTOLIN HFA) 108 (90 Base) MCG/ACT inhaler 4 puff (4 puffs Inhalation Given 08/28/16 2206)  predniSONE (DELTASONE) tablet 60 mg (60 mg Oral Given 08/28/16 2245)     Initial Impression / Assessment and Plan / ED Course  I have reviewed the triage vital signs and the nursing notes.  Pertinent labs & imaging results that were available during my care of the patient  were reviewed by me and considered in my medical decision making (see chart for details).     71 yoM h/o COPD, emphysema, MI, and chronic pain who presents with multiple chronic symptoms. Endorses 2 years of constant right-sided chest pain with chronic shortness of breath.  He reports several months of productive cough with green sputum.  He has previously completed antibiotic courses without improvement.  He reports heavy tobacco use.  He has previously been informed at he will need an MRI of his chest due to unknown incidental findings.  He reports poor sleeping over the past several months and is concerned that he will die due to respiratory difficulty if he goes to sleep.  He denies any acute symptoms over the past week.  No history of DVT or PE.  He has not recently seen his primary doctor.  Afebrile, VSS.  Lungs with diffuse smoker's wheeze on exam.  Abdomen soft benign throughout.  No evidence of leg swelling.  CXR showing no acute cardiopulmonary disease.  CBC, BMP unremarkable.  Spot troponin undetectable.  Suspect COPD exacerbation.  Prednisone and albuterol provided.  With chronic symptoms doubt ACS, aortic dissection, PE, pneumonia,  pericarditis, or Boerhaave's.  He is ambulating well and tolerating p.o.  Instructed patient on continued PCP follow-up for workup of chronic symptoms.  Pt care d/w Dr. Jodi Mourning  Final Clinical Impressions(s) / ED Diagnoses   Final diagnoses:  Wheezing  COPD exacerbation (HCC)    New Prescriptions Discharge Medication List as of 08/28/2016 10:39 PM    START taking these medications   Details  predniSONE (DELTASONE) 10 MG tablet Take 4 tablets (40 mg total) by mouth daily., Starting Mon 08/29/2016, Until Fri 09/02/2016, Print         Hebert Soho, MD 08/29/16 5621    Blane Ohara, MD 08/30/16 1626

## 2016-11-15 ENCOUNTER — Encounter (HOSPITAL_COMMUNITY): Payer: Self-pay | Admitting: *Deleted

## 2016-11-15 ENCOUNTER — Emergency Department (HOSPITAL_COMMUNITY): Payer: Medicaid Other

## 2016-11-15 ENCOUNTER — Emergency Department (HOSPITAL_COMMUNITY)
Admission: EM | Admit: 2016-11-15 | Discharge: 2016-11-16 | Disposition: A | Discharge status: Home / Self Care | Payer: Medicaid Other | Attending: Emergency Medicine | Admitting: Emergency Medicine

## 2016-11-15 DIAGNOSIS — S161XXA Strain of muscle, fascia and tendon at neck level, initial encounter: Secondary | ICD-10-CM | POA: Diagnosis not present

## 2016-11-15 DIAGNOSIS — J449 Chronic obstructive pulmonary disease, unspecified: Secondary | ICD-10-CM | POA: Insufficient documentation

## 2016-11-15 DIAGNOSIS — Y999 Unspecified external cause status: Secondary | ICD-10-CM | POA: Diagnosis not present

## 2016-11-15 DIAGNOSIS — M542 Cervicalgia: Secondary | ICD-10-CM

## 2016-11-15 DIAGNOSIS — I252 Old myocardial infarction: Secondary | ICD-10-CM | POA: Insufficient documentation

## 2016-11-15 DIAGNOSIS — Y939 Activity, unspecified: Secondary | ICD-10-CM | POA: Insufficient documentation

## 2016-11-15 DIAGNOSIS — Z79899 Other long term (current) drug therapy: Secondary | ICD-10-CM | POA: Diagnosis not present

## 2016-11-15 DIAGNOSIS — M791 Myalgia: Secondary | ICD-10-CM | POA: Insufficient documentation

## 2016-11-15 DIAGNOSIS — Z8673 Personal history of transient ischemic attack (TIA), and cerebral infarction without residual deficits: Secondary | ICD-10-CM | POA: Insufficient documentation

## 2016-11-15 DIAGNOSIS — M25511 Pain in right shoulder: Secondary | ICD-10-CM | POA: Diagnosis not present

## 2016-11-15 DIAGNOSIS — M7918 Myalgia, other site: Secondary | ICD-10-CM

## 2016-11-15 DIAGNOSIS — Z7982 Long term (current) use of aspirin: Secondary | ICD-10-CM | POA: Insufficient documentation

## 2016-11-15 DIAGNOSIS — F17210 Nicotine dependence, cigarettes, uncomplicated: Secondary | ICD-10-CM | POA: Diagnosis not present

## 2016-11-15 DIAGNOSIS — S199XXA Unspecified injury of neck, initial encounter: Secondary | ICD-10-CM | POA: Diagnosis present

## 2016-11-15 DIAGNOSIS — Y92410 Unspecified street and highway as the place of occurrence of the external cause: Secondary | ICD-10-CM | POA: Diagnosis not present

## 2016-11-15 MED ORDER — LORAZEPAM 1 MG PO TABS
1.0000 mg | ORAL_TABLET | Freq: Once | ORAL | Status: AC
  Administered 2016-11-15: 1 mg via ORAL
  Filled 2016-11-15: qty 1

## 2016-11-15 NOTE — Progress Notes (Signed)
Orthopedic Tech Progress Note Patient Details:  Vincent Dennis January 17, 1966 [id number]  Ortho Devices Type of Ortho Device: Sling immobilizer Ortho Device/Splint Location: rue Ortho Device/Splint Interventions: Ordered, Application, Adjustment   Trinna Post 11/15/2016, 11:44 PM
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
## 2016-11-15 NOTE — ED Notes (Signed)
See provider assessment 
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
## 2016-11-16 MED ORDER — METHOCARBAMOL 500 MG PO TABS: 500.0000 mg | ORAL_TABLET | Freq: Two times a day (BID) | ORAL | 0 refills | Status: AC

## 2016-11-16 NOTE — Discharge Instructions (Signed)
Neck CT and shoulder x-ray showed no signs of acute fracture or bony abnormalities.The pain your experiencing is likely due to muscle strain, you may take Robaxin in addition to your home pain medications as needed for pain management. The muscle soreness should improve over the next week. Follow up with your family doctor. Return to ED if pain is worsening, you develop weakness or numbness of extremities, or new or concerning symptoms develop.

## 2016-11-23 ENCOUNTER — Encounter (HOSPITAL_COMMUNITY): Payer: Self-pay

## 2016-11-23 DIAGNOSIS — M542 Cervicalgia: Secondary | ICD-10-CM | POA: Insufficient documentation

## 2016-11-23 DIAGNOSIS — Z5321 Procedure and treatment not carried out due to patient leaving prior to being seen by health care provider: Secondary | ICD-10-CM | POA: Insufficient documentation
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
## 2016-11-24 ENCOUNTER — Emergency Department (HOSPITAL_COMMUNITY)
Admission: EM | Admit: 2016-11-24 | Discharge: 2016-11-24 | Disposition: A | Discharge status: Home / Self Care | Payer: Medicaid Other | Attending: Emergency Medicine | Admitting: Emergency Medicine

## 2016-11-24 NOTE — ED Notes (Signed)
Bed: [facility]4 Expected date:  Expected time:  Means of arrival:  Comments: 
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
## 2016-11-24 NOTE — ED Notes (Signed)
Bed: [facility]1 Expected date:  Expected time:  Means of arrival:  Comments: 

## 2016-11-24 NOTE — ED Notes (Signed)
Son came to see Vincent Dennis and states he is taking him home.  Pt did not call for help or wheelchair to car. Informed Freight forwarderLisa RN charge nurse.

## 2016-11-26 ENCOUNTER — Emergency Department
Admission: EM | Admit: 2016-11-26 | Discharge: 2016-11-26 | Disposition: A | Discharge status: Home / Self Care | Payer: No Typology Code available for payment source | Attending: Student in an Organized Health Care Education/Training Program | Admitting: Student in an Organized Health Care Education/Training Program

## 2016-11-26 ENCOUNTER — Encounter: Payer: Self-pay | Admitting: Emergency Medicine

## 2016-11-26 DIAGNOSIS — G44209 Tension-type headache, unspecified, not intractable: Secondary | ICD-10-CM | POA: Insufficient documentation

## 2016-11-26 DIAGNOSIS — Y999 Unspecified external cause status: Secondary | ICD-10-CM | POA: Insufficient documentation

## 2016-11-26 DIAGNOSIS — Y929 Unspecified place or not applicable: Secondary | ICD-10-CM | POA: Insufficient documentation

## 2016-11-26 DIAGNOSIS — Z79899 Other long term (current) drug therapy: Secondary | ICD-10-CM | POA: Insufficient documentation

## 2016-11-26 DIAGNOSIS — F17200 Nicotine dependence, unspecified, uncomplicated: Secondary | ICD-10-CM | POA: Insufficient documentation

## 2016-11-26 DIAGNOSIS — S060X0A Concussion without loss of consciousness, initial encounter: Secondary | ICD-10-CM

## 2016-11-26 DIAGNOSIS — Z7982 Long term (current) use of aspirin: Secondary | ICD-10-CM | POA: Diagnosis not present

## 2016-11-26 DIAGNOSIS — Y939 Activity, unspecified: Secondary | ICD-10-CM | POA: Diagnosis not present

## 2016-11-26 DIAGNOSIS — J449 Chronic obstructive pulmonary disease, unspecified: Secondary | ICD-10-CM | POA: Diagnosis not present

## 2016-11-26 DIAGNOSIS — S0990XA Unspecified injury of head, initial encounter: Secondary | ICD-10-CM | POA: Diagnosis present

## 2016-11-26 MED ORDER — ORPHENADRINE CITRATE ER 100 MG PO TB12: 100.0000 mg | ORAL_TABLET | Freq: Two times a day (BID) | ORAL | 0 refills | Status: AC

## 2016-11-26 MED ORDER — ONDANSETRON 4 MG PO TBDP
4.0000 mg | ORAL_TABLET | Freq: Once | ORAL | Status: AC
  Administered 2016-11-26: 4 mg via ORAL
  Filled 2016-11-26: qty 1

## 2016-11-26 MED ORDER — KETOROLAC TROMETHAMINE 30 MG/ML IJ SOLN
60.0000 mg | Freq: Once | INTRAMUSCULAR | Status: AC
  Administered 2016-11-26: 60 mg via INTRAMUSCULAR
  Filled 2016-11-26: qty 2

## 2016-11-26 MED ORDER — KETOROLAC TROMETHAMINE 10 MG PO TABS: 10.0000 mg | ORAL_TABLET | Freq: Four times a day (QID) | ORAL | 0 refills | Status: AC | PRN

## 2016-11-26 MED ORDER — ONDANSETRON HCL 4 MG PO TABS: 4.0000 mg | ORAL_TABLET | Freq: Every day | ORAL | 0 refills | Status: AC | PRN

## 2016-11-26 MED ORDER — ORPHENADRINE CITRATE 30 MG/ML IJ SOLN
60.0000 mg | Freq: Two times a day (BID) | INTRAMUSCULAR | Status: DC
  Administered 2016-11-26: 60 mg via INTRAMUSCULAR
  Filled 2016-11-26: qty 2

## 2016-11-26 NOTE — Discharge Instructions (Signed)
Follow up with Orthopedics as soon as you can schedule.   Take medication as directed.
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
## 2016-12-26 ENCOUNTER — Ambulatory Visit: Payer: Self-pay | Attending: Orthopedic Surgery

## 2016-12-28 ENCOUNTER — Ambulatory Visit: Payer: Self-pay
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
## 2017-05-06 ENCOUNTER — Encounter (HOSPITAL_COMMUNITY): Payer: Self-pay | Admitting: Emergency Medicine

## 2017-05-06 ENCOUNTER — Emergency Department (HOSPITAL_COMMUNITY): Payer: Medicaid Other

## 2017-05-06 ENCOUNTER — Emergency Department (HOSPITAL_COMMUNITY)
Admission: EM | Admit: 2017-05-06 | Discharge: 2017-05-07 | Disposition: A | Discharge status: Home / Self Care | Payer: Medicaid Other | Attending: Emergency Medicine | Admitting: Emergency Medicine

## 2017-05-06 DIAGNOSIS — R079 Chest pain, unspecified: Secondary | ICD-10-CM | POA: Diagnosis present

## 2017-05-06 DIAGNOSIS — F17210 Nicotine dependence, cigarettes, uncomplicated: Secondary | ICD-10-CM | POA: Diagnosis not present

## 2017-05-06 DIAGNOSIS — J441 Chronic obstructive pulmonary disease with (acute) exacerbation: Secondary | ICD-10-CM | POA: Diagnosis not present

## 2017-05-06 DIAGNOSIS — Z7982 Long term (current) use of aspirin: Secondary | ICD-10-CM | POA: Diagnosis not present

## 2017-05-06 DIAGNOSIS — Z79899 Other long term (current) drug therapy: Secondary | ICD-10-CM | POA: Insufficient documentation

## 2017-05-06 DIAGNOSIS — Z8673 Personal history of transient ischemic attack (TIA), and cerebral infarction without residual deficits: Secondary | ICD-10-CM | POA: Diagnosis not present

## 2017-05-06 LAB — CBC
HEMATOCRIT: 43.9 % (ref 39.0–52.0)
Hemoglobin: 14 g/dL (ref 13.0–17.0)
MCH: 28.9 pg (ref 26.0–34.0)
MCHC: 31.9 g/dL (ref 30.0–36.0)
MCV: 90.5 fL (ref 78.0–100.0)
PLATELETS: 226 10*3/uL (ref 150–400)
RBC: 4.85 MIL/uL (ref 4.22–5.81)
RDW: 12.7 % (ref 11.5–15.5)
WBC: 8.8 10*3/uL (ref 4.0–10.5)

## 2017-05-06 LAB — BASIC METABOLIC PANEL
Anion gap: 8 (ref 5–15)
BUN: 14 mg/dL (ref 6–20)
CHLORIDE: 103 mmol/L (ref 101–111)
CO2: 29 mmol/L (ref 22–32)
CREATININE: 1.16 mg/dL (ref 0.61–1.24)
Calcium: 9.4 mg/dL (ref 8.9–10.3)
GFR calc Af Amer: >60 mL/min (ref >60)
GFR calc non Af Amer: >60 mL/min (ref >60)
Glucose, Bld: 93 mg/dL (ref 65–99)
POTASSIUM: 3.9 mmol/L (ref 3.5–5.1)
Sodium: 140 mmol/L (ref 135–145)

## 2017-05-06 LAB — TROPONIN I
Troponin I: <0.03 ng/mL (ref <0.03)
Troponin I: <0.03 ng/mL (ref <0.03)

## 2017-05-06 MED ORDER — OXYCODONE-ACETAMINOPHEN 5-325 MG PO TABS
2.0000 | ORAL_TABLET | Freq: Once | ORAL | Status: AC
  Administered 2017-05-07: 2 via ORAL
  Filled 2017-05-06: qty 2

## 2017-05-06 MED ORDER — IPRATROPIUM-ALBUTEROL 0.5-2.5 (3) MG/3ML IN SOLN
3.0000 mL | Freq: Once | RESPIRATORY_TRACT | Status: AC
  Administered 2017-05-06: 3 mL via RESPIRATORY_TRACT
  Filled 2017-05-06: qty 3

## 2017-05-06 MED ORDER — ALBUTEROL SULFATE (2.5 MG/3ML) 0.083% IN NEBU
2.5000 mg | INHALATION_SOLUTION | Freq: Once | RESPIRATORY_TRACT | Status: AC
  Administered 2017-05-06: 2.5 mg via RESPIRATORY_TRACT
  Filled 2017-05-06: qty 3

## 2017-05-06 MED ORDER — ALPRAZOLAM 0.5 MG PO TABS
1.0000 mg | ORAL_TABLET | Freq: Once | ORAL | Status: AC
  Administered 2017-05-07: 1 mg via ORAL
  Filled 2017-05-06: qty 2

## 2017-05-06 NOTE — ED Triage Notes (Signed)
Pt reports he began having heavy sensation in both arms around noon today followed by chest pain after bringing in the trash.  Began having shortness of breath and had to sit down at that time.  Pt reports pain comes and goes.

## 2017-05-06 NOTE — ED Provider Notes (Signed)
ED ECG REPORT   Date: 05/06/2017  Rate: 68  Rhythm: normal sinus rhythm  QRS Axis: normal  Intervals: normal  ST/T Wave abnormalities: nonspecific ST/T changes  Conduction Disutrbances:none  Narrative Interpretation:   Old EKG Reviewed: none available  I have personally reviewed the EKG tracing and agree with the computerized printout as noted. EKG with lots of artifact.  Did not crossover from MUSE.  Recommend repeat EKG.   Vanetta MuldersZackowski, Naijah Lacek, MD 05/06/17 [phone]

## 2017-05-06 NOTE — ED Notes (Signed)
Updated pt on wait time. Pt is very understanding.

## 2017-05-06 NOTE — ED Notes (Signed)
Have given EKG to Dr. Deretha EmoryZackowski

## 2017-05-06 NOTE — ED Provider Notes (Signed)
[facility] EMERGENCY DEPARTMENT Provider Note   CSN: [id number] Arrival date & time: 05/06/17  1801     History   Chief Complaint Chief Complaint  Patient presents with  . Chest Pain    HPI Vincent Dennis is a 52 y.o. male with a past medical history of emphysema, h/o of MI at age 52, seizure disorder, TIA anxiety and GERD presenting with a several week history of persistent cough with shortness of breath which is worsened when supine in association with wheezing and chest pressure.  His cough has been productive occasionally of a clear sputum, he was diagnosed with a pneumonia approximately 3 weeks ago at [facility] [facility] during which time he states he received IV antibiotics but never filled his outpatient antibiotics and reports was slowly feeling better until today.  He reports having nightly coughing and wheezing, but this afternoon shortly after rolling in his trash bin he became short of breath which resolved after rest.  He has intermittent episodes of shortness of breath with radiation of heaviness into his bilateral upper arms since around noon today.  He denies dizziness, chest pain, nausea, emesis or diaphoresis.  He has been feeling the symptoms now for approximately 5 hours since arriving here.  He states he will have symptoms like this about 2-3 times a year, has been admitted to the hospital where he has been observed for several days, but no new cardiac findings have been discovered.   The history is provided by the patient.    Past Medical History:  Diagnosis Date  . Angina   . Anxiety   . Arthritis   . Chronic back pain   . COPD (chronic obstructive pulmonary disease) (HCC)   . Depression   . Emphysema of lung (HCC)   . GERD (gastroesophageal reflux disease)   . H/O hiatal hernia   . Headache(784.0)   . Heart attack (HCC)   . Mental disorder   . Migraines   . Myocardial infarction (HCC)   . Recurrent upper respiratory infection (URI)   . Seizures (HCC)   .  Shortness of breath   . TIA (transient ischemic attack)     Patient Active Problem List   Diagnosis Date Noted  . Chest pain syndrome 10/13/2015  . History of seizures 04/10/2015  . Hyponatremia 04/10/2015  . Leukocytosis 04/10/2015  . Obesity 04/10/2015  . Insomnia 04/10/2015  . Weakness of right side of body 08/19/2011  . Tobacco use disorder 08/19/2011  . COPD (chronic obstructive pulmonary disease) (HCC) 08/16/2011  . Back pain, chronic 08/16/2011  . Headache(784.0) 08/16/2011  . History of MI (myocardial infarction) 08/16/2011  . Anxiety 08/16/2011    Past Surgical History:  Procedure Laterality Date  . BACK SURGERY         Home Medications    Prior to Admission medications   Medication Sig Start Date End Date Taking? Authorizing Provider  albuterol (PROVENTIL HFA;VENTOLIN HFA) 108 (90 BASE) MCG/ACT inhaler Inhale 1 puff into the lungs every 6 (six) hours as needed for wheezing or shortness of breath.    [provider]  ALPRAZolam Prudy Feeler(XANAX) 1 MG tablet Take 1 tablet (1 mg total) by mouth 3 (three) times daily as needed for anxiety. 10/02/13   Junious SilkMerrell, Hannah, PA-C  aspirin EC 81 MG tablet Take 1 tablet (81 mg total) by mouth daily. Over the counter 10/14/15   Rai, Delene Ruffiniipudeep K, MD  guaiFENesin (ROBITUSSIN) 100 MG/5ML SOLN Take 5 mLs (100 mg total) by mouth  every 4 (four) hours as needed for cough or to loosen phlegm. 08/28/16   Blane Ohara, MD  levETIRAcetam (KEPPRA) 500 MG tablet Take 1 tablet (500 mg total) by mouth 2 (two) times daily. 10/14/15   Rai, Delene Ruffini, MD  methocarbamol (ROBAXIN) 500 MG tablet Take 1 tablet (500 mg total) by mouth 2 (two) times daily. 11/16/16   Dartha Lodge, PA-C  methocarbamol (ROBAXIN) 750 MG tablet Take 750 mg by mouth 2 (two) times daily as needed for pain. 08/26/16   [provider]  nitroGLYCERIN (NITROSTAT) 0.4 MG SL tablet Place 1 tablet (0.4 mg total) under the tongue every 5 (five) minutes as needed for chest pain.  10/14/15   Rai, Ripudeep K, MD  ondansetron (ZOFRAN) 4 MG tablet Take 1 tablet (4 mg total) by mouth daily as needed for nausea or vomiting. 11/26/16 11/26/17  Little, Traci M, PA-C  orphenadrine (NORFLEX) 100 MG tablet Take 1 tablet (100 mg total) by mouth 2 (two) times daily. 11/26/16 11/26/17  Little, Traci M, PA-C  Oxycodone HCl 10 MG TABS Take 10 mg by mouth every 6 (six) hours as needed for pain. 08/26/16   [provider]  predniSONE (DELTASONE) 50 MG tablet Take one tablet by mouth daily for 4 days. 05/07/17   Burgess Amor, PA-C    Family History Family History  Problem Relation Age of Onset  . Diabetes Neg Hx     Social History Social History   Tobacco Use  . Smoking status: Current Every Day Smoker    Packs/day: 1.00    Years: 31.00    Pack years: 31.00    Types: Cigarettes  . Smokeless tobacco: Former Neurosurgeon    Types: Snuff  Substance Use Topics  . Alcohol use: No  . Drug use: Yes    Types: Marijuana     Allergies   Seroquel [quetiapine]   Review of Systems Review of Systems  Constitutional: Negative for chills, diaphoresis and fever.  HENT: Negative for congestion and sore throat.   Eyes: Negative.   Respiratory: Positive for chest tightness, shortness of breath and wheezing.   Cardiovascular: Negative for chest pain, palpitations and leg swelling.  Gastrointestinal: Negative for abdominal pain, nausea and vomiting.  Genitourinary: Negative.   Musculoskeletal: Negative for arthralgias, joint swelling and neck pain.  Skin: Negative.  Negative for rash and wound.  Neurological: Negative for dizziness, weakness, light-headedness, numbness and headaches.  Psychiatric/Behavioral: The patient is nervous/anxious.      Physical Exam Updated Vital Signs BP 132/87 (BP Location: Left Arm)   Pulse 63   Temp 97.7 F (36.5 C)   Resp 16   Ht 6\' 2"  (1.88 m)   Wt 108.9 kg (240 lb)   SpO2 98%   BMI 30.81 kg/m   Physical Exam  Constitutional: He appears  well-developed and well-nourished.  HENT:  Head: Normocephalic and atraumatic.  Eyes: Conjunctivae are normal.  Neck: Normal range of motion.  Cardiovascular: Normal rate, regular rhythm, normal heart sounds and intact distal pulses.  Pulmonary/Chest: Effort normal. No respiratory distress. He has wheezes.  Expiratory wheeze throughout all lung fields with prolonged expirations.  No rhonchi, no rales appreciated.  Abdominal: Soft. Bowel sounds are normal. There is no tenderness.  Musculoskeletal: Normal range of motion.  Neurological: He is alert.  Skin: Skin is warm and dry.  Psychiatric: He has a normal mood and affect.  Nursing note and vitals reviewed.    ED Treatments / Results  Labs (all labs  ordered are listed, but only abnormal results are displayed) Labs Reviewed  BASIC METABOLIC PANEL  CBC  TROPONIN I  TROPONIN I    EKG  EKG Interpretation  Date/Time:  Sunday May 07 2017 00:30:12 EST Ventricular Rate:  61 PR Interval:    QRS Duration: 104 QT Interval:  436 QTC Calculation: 440 R Axis:   36 Text Interpretation:  Sinus rhythm Probable lateral infarct, old Confirmed by Zackowski, Scott ([id number]) on 05/07/2017 12:39:16 AM       Radiology Dg Chest 2 View  Result Date: 05/06/2017 CLINICAL DATA:  Mid sternal chest pain, heaviness in both arms beginning at noon today, shortness of breath, history coronary disease post MI, COPD, smoker EXAM: CHEST  2 VIEW COMPARISON:  04/07/2016 FINDINGS: Normal heart size, mediastinal contours, and pulmonary vascularity. Chronic peribronchial thickening. Mild subsegmental atelectasis at RIGHT base. No definite acute infiltrate, pleural effusion or pneumothorax. Bones unremarkable. IMPRESSION: Mild chronic bronchitic changes with RIGHT basilar subsegmental atelectasis. Electronically Signed   By: Mark  Boles M.D.   On: 05/06/2017 18:52    Procedures Procedures (including critical care time)  Medications Ordered in ED Medications    predniSONE (DELTASONE) tablet 60 mg (not administered)  oxyCODONE-acetaminophen (PERCOCET/ROXICET) 5-325 MG per tablet 2 tablet (2 tablets Oral Given 05/07/17 0033)  ALPRAZolam (XANAX) tablet 1 mg (1 mg Oral Given 05/07/17 0033)  ipratropium-albuterol (DUONEB) 0.5-2.5 (3) MG/3ML nebulizer solution 3 mL (3 mLs Nebulization Given 05/06/17 2351)  albuterol (PROVENTIL) (2.5 MG/3ML) 0.083% nebulizer solution 2.5 mg (2.5 mg Nebulization Given 05/06/17 2351)     Initial Impression / Assessment and Plan / ED Course  I have reviewed the triage vital signs and the nursing notes.  Pertinent labs & imaging results that were available during my care of the patient were reviewed by me and considered in my medical decision making (see chart for details).    Patient with significant wheezing suggesting COPD flare.  He was given an albuterol and Atrovent nebulizer treatment and he had complete resolution of wheezing and chest pressure.  He had a stable EKG and delta troponins were both negative, no evidence for his symptoms being of cardiac etiology today.  He is also been afebrile, chest x-ray reviewed, there is no evidence for continued pneumonia or need for antibiotics at this time.  Discussed patient and findings with Dr. Zackowski prior to discharge home who agrees with plan.   Final Clinical Impressions(s) / ED Diagnoses   Final diagnoses:  Chronic obstructive pulmonary disease with acute exacerbation (HCC)    ED Discharge Orders        Ordered    predniSONE (DELTASONE) 50 MG tablet     02 /10/19 0045       Burgess Amor, PA-C 05/07/17 1610    Vanetta Mulders, MD 05/08/17 0100

## 2017-05-06 NOTE — ED Notes (Signed)
Pt states he was diagnosed with pneumonia and did not get his antibiotic filled. He had iv antibiotics at [facility] but did not have them filled. Pt saw his PCP yesterday and the PCP told him that it was going to take time to get over the weakness.

## 2017-05-07 MED ORDER — PREDNISONE 50 MG PO TABS: 60.0000 mg | ORAL_TABLET | Freq: Once | ORAL | Status: DC

## 2017-05-07 MED ORDER — PREDNISONE 50 MG PO TABS: ORAL_TABLET | ORAL | 0 refills | Status: AC

## 2017-05-07 NOTE — Discharge Instructions (Signed)
Take your next dose of prednisone tomorrow evening.  Continue using your albuterol inhaler 2 puffs every 4 hours if you are coughing or wheezing.

## 2017-10-20 ENCOUNTER — Encounter (HOSPITAL_COMMUNITY): Payer: Self-pay | Admitting: Emergency Medicine

## 2017-10-20 ENCOUNTER — Other Ambulatory Visit: Payer: Self-pay

## 2017-10-20 ENCOUNTER — Emergency Department (HOSPITAL_COMMUNITY)
Admission: EM | Admit: 2017-10-20 | Discharge: 2017-10-20 | Disposition: A | Discharge status: Home / Self Care | Payer: Medicaid Other | Attending: Emergency Medicine | Admitting: Emergency Medicine

## 2017-10-20 DIAGNOSIS — Z79899 Other long term (current) drug therapy: Secondary | ICD-10-CM | POA: Insufficient documentation

## 2017-10-20 DIAGNOSIS — J449 Chronic obstructive pulmonary disease, unspecified: Secondary | ICD-10-CM | POA: Diagnosis not present

## 2017-10-20 DIAGNOSIS — F419 Anxiety disorder, unspecified: Secondary | ICD-10-CM | POA: Diagnosis not present

## 2017-10-20 DIAGNOSIS — I252 Old myocardial infarction: Secondary | ICD-10-CM | POA: Insufficient documentation

## 2017-10-20 DIAGNOSIS — Z87891 Personal history of nicotine dependence: Secondary | ICD-10-CM | POA: Diagnosis not present

## 2017-10-20 DIAGNOSIS — Z760 Encounter for issue of repeat prescription: Secondary | ICD-10-CM | POA: Insufficient documentation

## 2017-10-20 DIAGNOSIS — Z8673 Personal history of transient ischemic attack (TIA), and cerebral infarction without residual deficits: Secondary | ICD-10-CM | POA: Diagnosis not present

## 2017-10-20 DIAGNOSIS — Z7982 Long term (current) use of aspirin: Secondary | ICD-10-CM | POA: Insufficient documentation

## 2017-10-20 NOTE — ED Triage Notes (Signed)
PT reports His Daughter is getting married in 3 hrs and he needs a refill on meds.

## 2017-10-20 NOTE — ED Notes (Signed)
Pt told the PA he wasn't waiting anymore. PA urged the pt to stay but he said he was leaving anyway.

## 2017-10-20 NOTE — ED Provider Notes (Signed)
[facility] EMERGENCY DEPARTMENT Provider Note   CSN: [id number] Arrival date & time: 10/20/17  1335     History   Chief Complaint Chief Complaint  Patient presents with  . Medication Refill    HPI Vincent Dennis is a 52 y.o. male presenting for medication refill of his alprazolam and oxycodone.  Patient states that his primary care provider is out of town this week and he has run out of his medication.  Patient states that he has had an increase of his pain since running out of his medication and he has had more anxiety.  Patient states that his daughter's wedding starts in 1 hour and he wants a prescription for his medications through the next week until he can see his primary care provider again.   Patient denies new injuries/trauma. Patient denies fever, nausea/vomiting/diarrhea, abdominal pain, chest pain, shortness of breath.  HPI  Past Medical History:  Diagnosis Date  . Angina   . Anxiety   . Arthritis   . Chronic back pain   . COPD (chronic obstructive pulmonary disease) (HCC)   . Depression   . Emphysema of lung (HCC)   . GERD (gastroesophageal reflux disease)   . H/O hiatal hernia   . Headache(784.0)   . Heart attack (HCC)   . Mental disorder   . Migraines   . Myocardial infarction (HCC)   . Recurrent upper respiratory infection (URI)   . Seizures (HCC)   . Shortness of breath   . TIA (transient ischemic attack)     Patient Active Problem List   Diagnosis Date Noted  . Chest pain syndrome 10/13/2015  . History of seizures 04/10/2015  . Hyponatremia 04/10/2015  . Leukocytosis 04/10/2015  . Obesity 04/10/2015  . Insomnia 04/10/2015  . Weakness of right side of body 08/19/2011  . Tobacco use disorder 08/19/2011  . COPD (chronic obstructive pulmonary disease) (HCC) 08/16/2011  . Back pain, chronic 08/16/2011  . Headache(784.0) 08/16/2011  . History of MI (myocardial infarction) 08/16/2011  . Anxiety 08/16/2011    Past Surgical  History:  Procedure Laterality Date  . BACK SURGERY          Home Medications    Prior to Admission medications   Medication Sig Start Date End Date Taking? Authorizing Provider  albuterol (PROVENTIL HFA;VENTOLIN HFA) 108 (90 BASE) MCG/ACT inhaler Inhale 1 puff into the lungs every 6 (six) hours as needed for wheezing or shortness of breath.    [provider]  ALPRAZolam Prudy Feeler) 1 MG tablet Take 1 tablet (1 mg total) by mouth 3 (three) times daily as needed for anxiety. 10/02/13   Junious Silk, PA-C  aspirin EC 81 MG tablet Take 1 tablet (81 mg total) by mouth daily. Over the counter 10/14/15   Rai, Delene Ruffini, MD  guaiFENesin (ROBITUSSIN) 100 MG/5ML SOLN Take 5 mLs (100 mg total) by mouth every 4 (four) hours as needed for cough or to loosen phlegm. 08/28/16   Blane Ohara, MD  levETIRAcetam (KEPPRA) 500 MG tablet Take 1 tablet (500 mg total) by mouth 2 (two) times daily. 10/14/15   Rai, Delene Ruffini, MD  methocarbamol (ROBAXIN) 500 MG tablet Take 1 tablet (500 mg total) by mouth 2 (two) times daily. 11/16/16   Dartha Lodge, PA-C  methocarbamol (ROBAXIN) 750 MG tablet Take 750 mg by mouth 2 (two) times daily as needed for pain. 08/26/16   [provider]  nitroGLYCERIN (NITROSTAT) 0.4 MG SL tablet Place 1 tablet (0.4  mg total) under the tongue every 5 (five) minutes as needed for chest pain. 10/14/15   Rai, Ripudeep K, MD  ondansetron (ZOFRAN) 4 MG tablet Take 1 tablet (4 mg total) by mouth daily as needed for nausea or vomiting. 11/26/16 11/26/17  Little, Traci M, PA-C  orphenadrine (NORFLEX) 100 MG tablet Take 1 tablet (100 mg total) by mouth 2 (two) times daily. 11/26/16 11/26/17  Little, Traci M, PA-C  Oxycodone HCl 10 MG TABS Take 10 mg by mouth every 6 (six) hours as needed for pain. 08/26/16   [provider]  predniSONE (DELTASONE) 50 MG tablet Take one tablet by mouth daily for 4 days. 05/07/17   Burgess AmorIdol, Julie, PA-C    Family History Family History  Problem Relation  Age of Onset  . Diabetes Neg Hx     Social History Social History   Tobacco Use  . Smoking status: Current Every Day Smoker    Packs/day: 1.00    Years: 31.00    Pack years: 31.00    Types: Cigarettes  . Smokeless tobacco: Former NeurosurgeonUser    Types: Snuff  Substance Use Topics  . Alcohol use: No  . Drug use: Yes    Types: Marijuana     Allergies   Seroquel [quetiapine]   Review of Systems Review of Systems  Constitutional: Negative.  Negative for chills, fatigue and fever.  HENT: Negative.  Negative for sore throat and trouble swallowing.   Eyes: Negative.  Negative for visual disturbance.  Respiratory: Negative.  Negative for shortness of breath.   Cardiovascular: Negative.  Negative for chest pain.  Gastrointestinal: Negative.  Negative for abdominal pain, diarrhea, nausea and vomiting.  Genitourinary: Negative.  Negative for dysuria and flank pain.  Musculoskeletal: Positive for arthralgias.       Patient states he has "pain all over", also states this is normal for him.  Skin: Negative.  Negative for rash.  Neurological: Negative.  Negative for dizziness, syncope, weakness, light-headedness and headaches.     Physical Exam Updated Vital Signs BP 137/80 (BP Location: Right Arm)   Pulse 88   Temp 98 F (36.7 C) (Oral)   Resp 18   SpO2 98%   Physical Exam  Constitutional: He is oriented to person, place, and time. He appears well-developed and well-nourished. No distress.  HENT:  Head: Normocephalic and atraumatic.  Right Ear: External ear normal.  Left Ear: External ear normal.  Nose: Nose normal.  Eyes: Pupils are equal, round, and reactive to light. EOM are normal.  Neck: Normal range of motion. Neck supple. No JVD present. No tracheal deviation present.  Cardiovascular: Normal rate and regular rhythm.  Pulmonary/Chest: Effort normal. No respiratory distress.  Musculoskeletal: Normal range of motion. He exhibits no deformity.  Patient ambulatory in room  without distress.  Neurological: He is alert and oriented to person, place, and time.  Skin: Skin is warm and dry.  Psychiatric: He has a normal mood and affect. His behavior is normal.     ED Treatments / Results  Labs (all labs ordered are listed, but only abnormal results are displayed) Labs Reviewed - No data to display  EKG None  Radiology No results found.  Procedures Procedures (including critical care time)  Medications Ordered in ED Medications - No data to display   Initial Impression / Assessment and Plan / ED Course  I have reviewed the triage vital signs and the nursing notes.  Pertinent labs & imaging results that were available during my  care of the patient were reviewed by me and considered in my medical decision making (see chart for details).  Clinical Course as of Oct 20 1833  Fri Oct 20, 2017  1541 I have spoken to the patient, informed him that we do not treat chronic pain here in the emergency department, I have reviewed and see PMP aware which shows that the patient should still have 2 days left on his oxycodone and alprazolam prescriptions.  Patient states understanding that we do not prescribe for chronic pain here he is upset that someone upfront told him otherwise.  I have encouraged him to stay to be discharged, he has left prior to discharge.   [BM]    Clinical Course User Index [BM] Bill Salinas, PA-C   Patient is here for a medication refill of 2 controlled substances, alprazolam and oxycodone.  Patient denies new injury or change in his pain.  Patient simply states that his primary care provider is out of town and he needs a refill.   I have reviewed [place] PMP aware which shows the patient should still have 2 days left on his prescriptions, I informed the patient of this.  Patient became slightly argumentative during our discussion, I have informed him that it is our policy to not treat chronic pain in the emergency department.  Patient states  understanding of our policy and was agreeable by the end of our discussion, we shook hands and he stated that he needed to leave to go to his daughter's wedding.  Patient states that he will follow-up with his primary care provider as soon as he can. I asked the patient to stay to receive his discharge paperwork, he refused and left prior to discharge.    Final Clinical Impressions(s) / ED Diagnoses   Final diagnoses:  Medication refill    ED Discharge Orders    None       Elizabeth Palau 10/20/17 1836    Terrilee Files, MD 10/21/17 1051

## 2017-10-20 NOTE — ED Notes (Signed)
ED Provider at bedside. 

## 2017-10-20 NOTE — ED Triage Notes (Signed)
Patient requesting refill of oxycodone 10-325 and alprazolam 1mg . States his PCP is out of town and is unable to refill his medication. Patient denies any other complaints.

## 2018-08-27 DEATH — deceased
